# Patient Record
Sex: Male | Born: 1980 | Race: White | Hispanic: No | Marital: Married | Smoking: Former smoker
Health system: Southern US, Community
[De-identification: ages and names within clinical notes are randomized; demographics above are authoritative.]

## PROBLEM LIST (undated history)

## (undated) DIAGNOSIS — R519 Headache, unspecified: Secondary | ICD-10-CM

## (undated) DIAGNOSIS — F1910 Other psychoactive substance abuse, uncomplicated: Secondary | ICD-10-CM

## (undated) DIAGNOSIS — F32A Depression, unspecified: Secondary | ICD-10-CM

## (undated) DIAGNOSIS — K469 Unspecified abdominal hernia without obstruction or gangrene: Secondary | ICD-10-CM

## (undated) DIAGNOSIS — F329 Major depressive disorder, single episode, unspecified: Secondary | ICD-10-CM

## (undated) DIAGNOSIS — Z720 Tobacco use: Secondary | ICD-10-CM

## (undated) DIAGNOSIS — F1010 Alcohol abuse, uncomplicated: Secondary | ICD-10-CM

## (undated) HISTORY — DX: Tobacco use: Z72.0

## (undated) HISTORY — DX: Headache, unspecified: R51.9

## (undated) HISTORY — PX: VASECTOMY: SHX75

## (undated) HISTORY — DX: Alcohol abuse, uncomplicated: F10.10

## (undated) HISTORY — DX: Depression, unspecified: F32.A

## (undated) HISTORY — DX: Other psychoactive substance abuse, uncomplicated: F19.10

## (undated) HISTORY — DX: Unspecified abdominal hernia without obstruction or gangrene: K46.9

---
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
## 2001-09-27 ENCOUNTER — Emergency Department (HOSPITAL_COMMUNITY): Admission: EM | Admit: 2001-09-27 | Discharge: 2001-09-28 | Payer: Self-pay | Admitting: Emergency Medicine

## 2002-03-12 HISTORY — PX: OTHER SURGICAL HISTORY: SHX169

## 2011-07-11 ENCOUNTER — Encounter: Payer: Self-pay | Admitting: *Deleted

## 2011-07-11 ENCOUNTER — Ambulatory Visit (INDEPENDENT_AMBULATORY_CARE_PROVIDER_SITE_OTHER): Payer: BC Managed Care – PPO | Admitting: Family Medicine

## 2011-07-11 ENCOUNTER — Encounter: Payer: Self-pay | Admitting: Family Medicine

## 2011-07-11 DIAGNOSIS — IMO0001 Reserved for inherently not codable concepts without codable children: Secondary | ICD-10-CM

## 2011-07-11 DIAGNOSIS — R350 Frequency of micturition: Secondary | ICD-10-CM

## 2011-07-11 DIAGNOSIS — K439 Ventral hernia without obstruction or gangrene: Secondary | ICD-10-CM | POA: Insufficient documentation

## 2011-07-11 DIAGNOSIS — Z0000 Encounter for general adult medical examination without abnormal findings: Secondary | ICD-10-CM | POA: Insufficient documentation

## 2011-07-11 LAB — POCT URINALYSIS DIPSTICK
Bilirubin, UA: NEGATIVE
Blood, UA: NEGATIVE
Glucose, UA: NEGATIVE
Ketones, UA: NEGATIVE
Leukocytes, UA: NEGATIVE
Nitrite, UA: NEGATIVE
Spec Grav, UA: 1.01
Urobilinogen, UA: 0.2
pH, UA: 7.5

## 2011-07-11 LAB — CBC WITH DIFFERENTIAL/PLATELET
Basophils Absolute: 0 10*3/uL (ref 0.0–0.1)
Basophils Relative: 0.6 % (ref 0.0–3.0)
Eosinophils Absolute: 0 10*3/uL (ref 0.0–0.7)
Eosinophils Relative: 1.1 % (ref 0.0–5.0)
HCT: 43.1 % (ref 39.0–52.0)
Hemoglobin: 14.3 g/dL (ref 13.0–17.0)
Lymphocytes Relative: 29.2 % (ref 12.0–46.0)
Lymphs Abs: 1.2 10*3/uL (ref 0.7–4.0)
MCHC: 33.1 g/dL (ref 30.0–36.0)
MCV: 91.4 fl (ref 78.0–100.0)
Monocytes Absolute: 0.4 10*3/uL (ref 0.1–1.0)
Monocytes Relative: 10 % (ref 3.0–12.0)
Neutro Abs: 2.3 10*3/uL (ref 1.4–7.7)
Neutrophils Relative %: 59.1 % (ref 43.0–77.0)
Platelets: 237 10*3/uL (ref 150.0–400.0)
RBC: 4.72 Mil/uL (ref 4.22–5.81)
RDW: 13.8 % (ref 11.5–14.6)
WBC: 4 10*3/uL — ABNORMAL LOW (ref 4.5–10.5)

## 2011-07-11 LAB — BASIC METABOLIC PANEL
BUN: 17 mg/dL (ref 6–23)
CO2: 30 mEq/L (ref 19–32)
Calcium: 9.1 mg/dL (ref 8.4–10.5)
Chloride: 103 mEq/L (ref 96–112)
Creatinine, Ser: 1.4 mg/dL (ref 0.4–1.5)
GFR: 63.99 mL/min (ref >60.00)
Glucose, Bld: 64 mg/dL — ABNORMAL LOW (ref 70–99)
Potassium: 3.8 mEq/L (ref 3.5–5.1)
Sodium: 141 mEq/L (ref 135–145)

## 2011-07-11 LAB — HEPATIC FUNCTION PANEL
ALT: 17 U/L (ref 0–53)
AST: 22 U/L (ref 0–37)
Albumin: 4.4 g/dL (ref 3.5–5.2)
Alkaline Phosphatase: 52 U/L (ref 39–117)
Bilirubin, Direct: 0 mg/dL (ref 0.0–0.3)
Total Bilirubin: 0.7 mg/dL (ref 0.3–1.2)
Total Protein: 6.8 g/dL (ref 6.0–8.3)

## 2011-07-11 LAB — LIPID PANEL
Cholesterol: 169 mg/dL (ref 0–200)
HDL: 55.2 mg/dL (ref >39.00)
LDL Cholesterol: 100 mg/dL — ABNORMAL HIGH (ref 0–99)
Total CHOL/HDL Ratio: 3
Triglycerides: 69 mg/dL (ref 0.0–149.0)
VLDL: 13.8 mg/dL (ref 0.0–40.0)

## 2011-07-11 LAB — TSH: TSH: 1.08 u[IU]/mL (ref 0.35–5.50)

## 2011-07-11 NOTE — Progress Notes (Signed)
  Subjective:    Patient ID: Brandon Burch, male    DOB: 07-Oct-1980, 31 y.o.   MRN: [id number]  HPI New to establish.  No previous MD, last seen in [employer] 5 yrs ago.  Urinary frequency- noticed over the last year, admits to supplement use that requires increased water intake and excessive use of caffeine.  Abdominal bulge- just above belly button, occurred while working out, size of a quarter, will become mildly painful w/ excessive food intake.  Not enlarging.   Review of Systems Patient reports no vision/hearing changes, anorexia, fever ,adenopathy, persistant/recurrent hoarseness, swallowing issues, chest pain, palpitations, edema, persistant/recurrent cough, hemoptysis, dyspnea (rest,exertional, paroxysmal nocturnal), gastrointestinal  bleeding (melena, rectal bleeding), abdominal pain, excessive heart burn syncope, focal weakness, memory loss, numbness & tingling, skin/hair/nail changes, depression, anxiety, abnormal bruising/bleeding, musculoskeletal symptoms/signs.     Objective:   Physical Exam BP 118/82  Pulse 62  Temp(Src) 98.7 F (37.1 C) (Oral)  Ht 6' (1.829 m)  Wt 229 lb 3.2 oz (103.964 kg)  BMI 31.08 kg/m2  SpO2 98%  General Appearance:    Alert, cooperative, no distress, appears stated age  Head:    Normocephalic, without obvious abnormality, atraumatic  Eyes:    PERRL, conjunctiva/corneas clear, EOM's intact, fundi    benign, both eyes       Ears:    Normal TM's and external ear canals, both ears  Nose:   Nares normal, septum midline, mucosa normal, no drainage   or sinus tenderness  Throat:   Lips, mucosa, and tongue normal; teeth and gums normal  Neck:   Supple, symmetrical, trachea midline, no adenopathy;       thyroid:  No enlargement/tenderness/nodules  Back:     Symmetric, no curvature, ROM normal, no CVA tenderness  Lungs:     Clear to auscultation bilaterally, respirations unlabored  Chest wall:    No tenderness or deformity  Heart:    Regular rate and  rhythm, S1 and S2 normal, no murmur, rub   or gallop  Abdomen:     Soft, non-tender, bowel sounds active all four quadrants,    no masses, no organomegaly.  Small periumbilical/epigastric hernia  Genitalia:    Normal male without lesion, discharge or tenderness  Rectal:    Deferred due to young age  Extremities:   Extremities normal, atraumatic, no cyanosis or edema  Pulses:   2+ and symmetric all extremities  Skin:   Skin color, texture, turgor normal, no rashes or lesions  Lymph nodes:   Cervical, supraclavicular, and axillary nodes normal  Neurologic:   CNII-XII intact. Normal strength, sensation and reflexes      throughout          Assessment & Plan:

## 2011-07-11 NOTE — Patient Instructions (Signed)
You look great!  Keep up the good work! We'll notify you of your lab results Someone will call you with your surgery appt Welcome!!  We're glad to have you!!!

## 2011-07-17 NOTE — Assessment & Plan Note (Signed)
New.  Refer to surgery for discussion on tx.

## 2011-07-17 NOTE — Assessment & Plan Note (Signed)
New.  UA WNL.  Most likely due to amount of fluids consumed while taking supplements.  No need for additional w/u at this time.  Reassurance provided.

## 2011-07-17 NOTE — Assessment & Plan Note (Signed)
Pt's PE WNL w/ exception of small periumbilical/epigastric hernia.  Check labs.  Anticipatory guidance provided.

## 2011-08-03 ENCOUNTER — Encounter (INDEPENDENT_AMBULATORY_CARE_PROVIDER_SITE_OTHER): Payer: Self-pay | Admitting: General Surgery

## 2011-08-03 ENCOUNTER — Ambulatory Visit (INDEPENDENT_AMBULATORY_CARE_PROVIDER_SITE_OTHER): Payer: BC Managed Care – PPO | Admitting: General Surgery

## 2011-08-03 VITALS — BP 114/74 | HR 64 | Temp 98.2°F | Resp 14 | Ht 72.0 in | Wt 231.8 lb

## 2011-08-03 DIAGNOSIS — K439 Ventral hernia without obstruction or gangrene: Secondary | ICD-10-CM

## 2011-08-03 NOTE — Progress Notes (Signed)
Patient ID: Brandon Burch, male   DOB: 01/22/1981, 31 y.o.   MRN: [id number]  Chief Complaint  Patient presents with  . New Evaluation    New Pt. Eval abd Wall    HPI Brandon Burch is a 31 y.o. male.  This patient was referred by Dr. Taborri for evaluation of a ventral hernia. He has a small bulge just above his umbilicus which has been present for several years. He says he is first occurred while working out and doing crunches he felt some pain in the area and since then he has noticed this bulge. He says that this does cause discomfort which he describes as a "sharp pain" which is mainly after eating or drinking too much. He states that his bowels are normal blood he does always at this discomfort. HPI  Past Medical History  Diagnosis Date  . Hernia     Past Surgical History  Procedure Date  . Shoulder surgery 2004    Left Shoulder    History reviewed. No pertinent family history.  Social History History  Substance Use Topics  . Smoking status: Former Smoker    Quit date: 03/12/2009  . Smokeless tobacco: Never Used  . Alcohol Use: No    No Known Allergies  No current outpatient prescriptions on file.    Review of Systems Review of Systems All other review of systems negative or noncontributory except as stated in the HPI  Blood pressure 114/74, pulse 64, temperature 98.2 F (36.8 C), temperature source Temporal, resp. rate 14, height 6' (1.829 m), weight 231 lb 12.8 oz (105.144 kg).  Physical Exam Physical Exam Physical Exam  Vitals reviewed. Constitutional: He is oriented to person, place, and time. He appears well-developed and well-nourished. No distress.  HENT:  Head: Normocephalic and atraumatic.  Mouth/Throat: No oropharyngeal exudate.  Eyes: Conjunctivae and EOM are normal. Pupils are equal, round, and reactive to light. Right eye exhibits no discharge. Left eye exhibits no discharge. No scleral icterus.  Neck: Normal range of motion. No  tracheal deviation present.  Cardiovascular: Normal rate, regular rhythm and normal heart sounds.   Pulmonary/Chest: Effort normal and breath sounds normal. No stridor. No respiratory distress. He has no wheezes. He has no rales. He exhibits no tenderness.  Abdominal: Soft. Bowel sounds are normal. He exhibits no distension and no mass. There is no tenderness. There is no rebound and no guarding. Small supraumbilical bulge, not reducible but nontender and no evidence of strangulation Musculoskeletal: Normal range of motion. He exhibits no edema and no tenderness.  Neurological: He is alert and oriented to person, place, and time.  Skin: Skin is warm and dry. No rash noted. He is not diaphoretic. No erythema. No pallor.  Psychiatric: He has a normal mood and affect. His behavior is normal. Judgment and thought content normal.   Data Reviewed   Assessment    Supraumbilical ventral hernia He does appear to have a incarcerated supraumbilical hernia. This is nontender on exam today. It is small and located just above the umbilicus but appears to be separate from an umbilical hernia. I discussed with him the options for repair and have recommended open repair of this small ventral hernia. The risks of procedure were discussed including infection, bleeding, pain, scarring, recurrence, need for mesh, bowel injury and need for more extensive surgery and he expressed understanding and desires to proceed with open ventral hernia repair with possible mesh.    Plan    We will schedule his   procedure when available.       Brandon Burch DAVID 08/03/2011, 9:15 AM    

## 2011-08-15 ENCOUNTER — Encounter (HOSPITAL_COMMUNITY): Payer: Self-pay | Admitting: Pharmacy Technician

## 2011-08-20 ENCOUNTER — Telehealth (INDEPENDENT_AMBULATORY_CARE_PROVIDER_SITE_OTHER): Payer: Self-pay | Admitting: General Surgery

## 2011-08-20 ENCOUNTER — Telehealth (INDEPENDENT_AMBULATORY_CARE_PROVIDER_SITE_OTHER): Payer: Self-pay

## 2011-08-20 ENCOUNTER — Encounter (HOSPITAL_COMMUNITY): Payer: Self-pay | Admitting: Pharmacy Technician

## 2011-08-20 NOTE — Telephone Encounter (Signed)
Pt's wife, Shawna Orleans, calling from work, to ask about husbands's surgery.  She wants to know the "down-time" he will have and the extent of his need for help at home, etc.  Also, pt is under the impression he can return to work the next day.  Please call her to discuss.  Work # [phone].

## 2011-08-20 NOTE — Telephone Encounter (Signed)
Called patient to discuss length of being our of work after his ventral hernia repair, patient works in a [employer].  I explained to him that he should plan on being our of work at least 1-2 weeks.  Also, he can discuss this with Dr. Biagio Quint the morning of surgery.  Patient instructed to call our office if he has any other questions or concerns.

## 2011-08-22 ENCOUNTER — Encounter (HOSPITAL_COMMUNITY): Payer: Self-pay

## 2011-08-22 ENCOUNTER — Encounter (HOSPITAL_COMMUNITY)
Admission: RE | Admit: 2011-08-22 | Discharge: 2011-08-22 | Disposition: A | Discharge status: Home / Self Care | Payer: BC Managed Care – PPO | Source: Ambulatory Visit | Attending: General Surgery | Admitting: General Surgery

## 2011-08-22 VITALS — BP 145/75 | HR 57 | Temp 97.0°F | Resp 16 | Ht 72.0 in | Wt 230.1 lb

## 2011-08-22 DIAGNOSIS — K439 Ventral hernia without obstruction or gangrene: Secondary | ICD-10-CM

## 2011-08-22 LAB — CBC
MCHC: 33.3 g/dL (ref 30.0–36.0)
Platelets: 263 10*3/uL (ref 150–400)
RDW: 12.6 % (ref 11.5–15.5)
WBC: 4.2 10*3/uL (ref 4.0–10.5)

## 2011-08-22 LAB — SURGICAL PCR SCREEN
MRSA, PCR: NEGATIVE
Staphylococcus aureus: POSITIVE — AB

## 2011-08-22 NOTE — Pre-Procedure Instructions (Signed)
08/21/11 Teach Back method used for preop appointment.

## 2011-08-22 NOTE — Patient Instructions (Signed)
20 Brandon Burch  08/22/2011   Your procedure is scheduled on:  08/28/11 0730am-0950am  Report to [facility] at 0530 AM.  Call this number if you have problems the morning of surgery: [phone]   Remember:   Do not eat food:After Midnight.  May have clear liquids:until Midnight .    Take these medicines the morning of surgery with A SIP OF WATER   Do not wear jewelry,   Do not wear lotions, powders, or perfumes.  Do not shave 48 hours prior to surgery. Men may shave face and neck.  Do not bring valuables to the hospital.  Contacts, dentures or bridgework may not be worn into surgery.      Patients discharged the day of surgery will not be allowed to drive home.  Name and phone number of your driver:  Special Instructions: CHG Shower Use Special Wash: 1/2 bottle night before surgery and 1/2 bottle morning of surgery. shower chin to toes with CHG.  Wash face and private parts with regular soap.    Please read over the following fact sheets that you were given: MRSA Information, coughing and deep breathing exercises, leg exercises

## 2011-08-28 ENCOUNTER — Ambulatory Visit (HOSPITAL_COMMUNITY)
Admission: RE | Admit: 2011-08-28 | Discharge: 2011-08-28 | Disposition: A | Discharge status: Home / Self Care | Payer: BC Managed Care – PPO | Source: Ambulatory Visit | Attending: General Surgery | Admitting: General Surgery

## 2011-08-28 ENCOUNTER — Encounter (HOSPITAL_COMMUNITY): Payer: Self-pay | Admitting: Anesthesiology

## 2011-08-28 ENCOUNTER — Encounter (HOSPITAL_COMMUNITY): Payer: Self-pay | Admitting: *Deleted

## 2011-08-28 ENCOUNTER — Ambulatory Visit (HOSPITAL_COMMUNITY): Payer: BC Managed Care – PPO | Admitting: Anesthesiology

## 2011-08-28 ENCOUNTER — Encounter (HOSPITAL_COMMUNITY)
Admission: RE | Disposition: A | Discharge status: Home / Self Care | Payer: Self-pay | Source: Ambulatory Visit | Attending: General Surgery

## 2011-08-28 DIAGNOSIS — K439 Ventral hernia without obstruction or gangrene: Secondary | ICD-10-CM | POA: Insufficient documentation

## 2011-08-28 DIAGNOSIS — Z01812 Encounter for preprocedural laboratory examination: Secondary | ICD-10-CM | POA: Insufficient documentation

## 2011-08-28 HISTORY — PX: VENTRAL HERNIA REPAIR: SHX424

## 2011-08-28 SURGERY — REPAIR, HERNIA, VENTRAL
Anesthesia: General | Site: Abdomen | Wound class: Clean

## 2011-08-28 MED ORDER — MEPERIDINE HCL 50 MG/ML IJ SOLN: 6.2500 mg | INTRAMUSCULAR | Status: DC | PRN

## 2011-08-28 MED ORDER — LACTATED RINGERS IV SOLN
INTRAVENOUS | Status: DC | PRN
  Administered 2011-08-28 (×2): via INTRAVENOUS

## 2011-08-28 MED ORDER — LACTATED RINGERS IV SOLN: INTRAVENOUS | Status: DC

## 2011-08-28 MED ORDER — ONDANSETRON HCL 4 MG/2ML IJ SOLN
INTRAMUSCULAR | Status: DC | PRN
  Administered 2011-08-28: 4 mg via INTRAVENOUS

## 2011-08-28 MED ORDER — DEXAMETHASONE SODIUM PHOSPHATE 10 MG/ML IJ SOLN
INTRAMUSCULAR | Status: DC | PRN
  Administered 2011-08-28: 10 mg via INTRAVENOUS

## 2011-08-28 MED ORDER — BUPIVACAINE HCL 0.25 % IJ SOLN
INTRAMUSCULAR | Status: DC | PRN
  Administered 2011-08-28: 15 mL

## 2011-08-28 MED ORDER — BUPIVACAINE HCL (PF) 0.25 % IJ SOLN
INTRAMUSCULAR | Status: AC
  Filled 2011-08-28: qty 30

## 2011-08-28 MED ORDER — HYDROCODONE-ACETAMINOPHEN 5-325 MG PO TABS: 1.0000 | ORAL_TABLET | ORAL | Status: AC | PRN

## 2011-08-28 MED ORDER — LIDOCAINE-EPINEPHRINE 1 %-1:100000 IJ SOLN
INTRAMUSCULAR | Status: AC
  Filled 2011-08-28: qty 1

## 2011-08-28 MED ORDER — ACETAMINOPHEN 10 MG/ML IV SOLN
INTRAVENOUS | Status: AC
  Filled 2011-08-28: qty 100

## 2011-08-28 MED ORDER — CEFAZOLIN SODIUM-DEXTROSE 2-3 GM-% IV SOLR
INTRAVENOUS | Status: AC
  Filled 2011-08-28: qty 50

## 2011-08-28 MED ORDER — ACETAMINOPHEN 10 MG/ML IV SOLN
INTRAVENOUS | Status: DC | PRN
  Administered 2011-08-28: 1000 mg via INTRAVENOUS

## 2011-08-28 MED ORDER — CEFAZOLIN SODIUM-DEXTROSE 2-3 GM-% IV SOLR
2.0000 g | INTRAVENOUS | Status: AC
  Administered 2011-08-28: 2 g via INTRAVENOUS

## 2011-08-28 MED ORDER — LIDOCAINE HCL (CARDIAC) 20 MG/ML IV SOLN
INTRAVENOUS | Status: DC | PRN
  Administered 2011-08-28: 50 mg via INTRAVENOUS

## 2011-08-28 MED ORDER — 0.9 % SODIUM CHLORIDE (POUR BTL) OPTIME
TOPICAL | Status: DC | PRN
  Administered 2011-08-28: 1000 mL

## 2011-08-28 MED ORDER — FENTANYL CITRATE 0.05 MG/ML IJ SOLN
INTRAMUSCULAR | Status: DC | PRN
  Administered 2011-08-28: 150 ug via INTRAVENOUS
  Administered 2011-08-28: 100 ug via INTRAVENOUS

## 2011-08-28 MED ORDER — LIDOCAINE-EPINEPHRINE (PF) 1 %-1:200000 IJ SOLN
INTRAMUSCULAR | Status: DC | PRN
  Administered 2011-08-28: 15 mL

## 2011-08-28 MED ORDER — HYDROMORPHONE HCL PF 1 MG/ML IJ SOLN
0.2500 mg | INTRAMUSCULAR | Status: DC | PRN
Start: 2011-08-28 — End: 2011-08-28

## 2011-08-28 MED ORDER — PROMETHAZINE HCL 25 MG/ML IJ SOLN: 6.2500 mg | INTRAMUSCULAR | Status: DC | PRN

## 2011-08-28 MED ORDER — ROCURONIUM BROMIDE 100 MG/10ML IV SOLN
INTRAVENOUS | Status: DC | PRN
  Administered 2011-08-28 (×2): 10 mg via INTRAVENOUS
  Administered 2011-08-28: 50 mg via INTRAVENOUS

## 2011-08-28 MED ORDER — LIDOCAINE HCL 4 % MT SOLN
OROMUCOSAL | Status: DC | PRN
  Administered 2011-08-28: 4 mL via TOPICAL

## 2011-08-28 MED ORDER — MIDAZOLAM HCL 5 MG/5ML IJ SOLN
INTRAMUSCULAR | Status: DC | PRN
  Administered 2011-08-28: 2 mg via INTRAVENOUS

## 2011-08-28 MED ORDER — NEOSTIGMINE METHYLSULFATE 1 MG/ML IJ SOLN
INTRAMUSCULAR | Status: DC | PRN
  Administered 2011-08-28: 5 mg via INTRAVENOUS

## 2011-08-28 MED ORDER — PROPOFOL 10 MG/ML IV EMUL
INTRAVENOUS | Status: DC | PRN
  Administered 2011-08-28: 200 mg via INTRAVENOUS

## 2011-08-28 MED ORDER — GLYCOPYRROLATE 0.2 MG/ML IJ SOLN
INTRAMUSCULAR | Status: DC | PRN
  Administered 2011-08-28: .6 mg via INTRAVENOUS

## 2011-08-28 SURGICAL SUPPLY — 37 items
ADH SKN CLS APL DERMABOND .7 (GAUZE/BANDAGES/DRESSINGS) ×2
APL SKNCLS STERI-STRIP NONHPOA (GAUZE/BANDAGES/DRESSINGS)
BALL CTTN LRG ABS STRL LF (GAUZE/BANDAGES/DRESSINGS) ×2
BENZOIN TINCTURE PRP APPL 2/3 (GAUZE/BANDAGES/DRESSINGS) IMPLANT
BLADE HEX COATED 2.75 (ELECTRODE) ×3 IMPLANT
CANISTER SUCTION 2500CC (MISCELLANEOUS) ×3 IMPLANT
CLOTH BEACON ORANGE TIMEOUT ST (SAFETY) ×3 IMPLANT
COTTONBALL LRG STERILE PKG (GAUZE/BANDAGES/DRESSINGS) ×2 IMPLANT
DECANTER SPIKE VIAL GLASS SM (MISCELLANEOUS) ×3 IMPLANT
DERMABOND ADVANCED (GAUZE/BANDAGES/DRESSINGS) ×1
DERMABOND ADVANCED .7 DNX12 (GAUZE/BANDAGES/DRESSINGS) ×1 IMPLANT
DRAPE LAPAROSCOPIC ABDOMINAL (DRAPES) ×3 IMPLANT
DRSG TEGADERM 4X4.75 (GAUZE/BANDAGES/DRESSINGS) ×2 IMPLANT
ELECT COATED BLADE 2.86 ST (ELECTRODE) ×2 IMPLANT
ELECT REM PT RETURN 9FT ADLT (ELECTROSURGICAL) ×3
ELECTRODE REM PT RTRN 9FT ADLT (ELECTROSURGICAL) ×2 IMPLANT
GLOVE BIO SURGEON STRL SZ7 (GLOVE) ×3 IMPLANT
GLOVE BIOGEL PI IND STRL 7.0 (GLOVE) ×2 IMPLANT
GLOVE BIOGEL PI INDICATOR 7.0 (GLOVE) ×1
GLOVE SURG SS PI 7.5 STRL IVOR (GLOVE) ×6 IMPLANT
GOWN STRL NON-REIN LRG LVL3 (GOWN DISPOSABLE) ×3 IMPLANT
GOWN STRL REIN XL XLG (GOWN DISPOSABLE) ×6 IMPLANT
KIT BASIN OR (CUSTOM PROCEDURE TRAY) ×3 IMPLANT
NEEDLE HYPO 22GX1.5 SAFETY (NEEDLE) ×2 IMPLANT
NS IRRIG 1000ML POUR BTL (IV SOLUTION) ×3 IMPLANT
PACK GENERAL/GYN (CUSTOM PROCEDURE TRAY) ×3 IMPLANT
SPONGE GAUZE 4X4 12PLY (GAUZE/BANDAGES/DRESSINGS) ×2 IMPLANT
SPONGE LAP 18X18 X RAY DECT (DISPOSABLE) ×4 IMPLANT
STAPLER VISISTAT 35W (STAPLE) ×2 IMPLANT
STRIP CLOSURE SKIN 1/2X4 (GAUZE/BANDAGES/DRESSINGS) IMPLANT
SUT ETHIBOND 0 36 GRN (SUTURE) ×2 IMPLANT
SUT ETHIBOND 0 MO6 C/R (SUTURE) ×2 IMPLANT
SUT MNCRL AB 4-0 PS2 18 (SUTURE) ×2 IMPLANT
SUT VIC AB 3-0 SH 27 (SUTURE) ×3
SUT VIC AB 3-0 SH 27XBRD (SUTURE) ×1 IMPLANT
SYR CONTROL 10ML LL (SYRINGE) ×2 IMPLANT
TOWEL OR 17X26 10 PK STRL BLUE (TOWEL DISPOSABLE) ×3 IMPLANT

## 2011-08-28 NOTE — Transfer of Care (Signed)
Immediate Anesthesia Transfer of Care Note  Patient: Brandon Burch  Procedure(s) Performed: Procedure(s) (LRB): HERNIA REPAIR VENTRAL ADULT (N/A)  Patient Location: PACU  Anesthesia Type: General  Level of Consciousness: awake and alert   Airway & Oxygen Therapy: Patient Spontanous Breathing and Patient connected to face mask oxygen  Post-op Assessment: Report given to PACU RN and Post -op Vital signs reviewed and stable  Post vital signs: Reviewed and stable  Complications: No apparent anesthesia complications

## 2011-08-28 NOTE — Progress Notes (Signed)
Pt up OOB ambulated to bathroom.  Pt voided large amount of clear yellow urine.  Pt tolerated well.

## 2011-08-28 NOTE — Interval H&P Note (Signed)
History and Physical Interval Note:  08/28/2011 7:25 AM  Brandon Burch  has presented today for surgery, with the diagnosis of to repair defect in abdominal wall  The various methods of treatment have been discussed with the patient and family. After consideration of risks, benefits and other options for treatment, the patient has consented to  Procedure(s) (LRB): HERNIA REPAIR VENTRAL ADULT (N/A) INSERTION OF MESH (N/A) as a surgical intervention .  The patient's history has been reviewed, patient examined, no change in status, stable for surgery.  I have reviewed the patients' chart and labs.  Questions were answered to the patient's satisfaction.     [name]  Pt. Seen and evaluated.  Site marked preoperatively.  Risks of infection, bleeding, pain, scarring, recurrence, bowel injury, chronic pain, and need for repeat surgery again discussed and he desires to proceed with open ventral hernia repair with possible mesh.

## 2011-08-28 NOTE — Brief Op Note (Signed)
08/28/2011  9:08 AM  PATIENT:  Gar Gibbon Hand  31 y.o. male  PRE-OPERATIVE DIAGNOSIS:  to repair defect in abdominal wall  POST-OPERATIVE DIAGNOSIS:  to repair defect in abdominal wall  PROCEDURE:  Procedure(s) (LRB): HERNIA REPAIR VENTRAL ADULT (N/A)  SURGEON:  Surgeon(s) and Role:    * Lodema Pilot, DO - Primary  PHYSICIAN ASSISTANT:   ASSISTANTS: none   ANESTHESIA:   general  EBL:  Total I/O In: 1500 [I.V.:1500] Out: -   BLOOD ADMINISTERED:none  DRAINS: none   LOCAL MEDICATIONS USED:  MARCAINE    and LIDOCAINE   SPECIMEN:  No Specimen  DISPOSITION OF SPECIMEN:  N/A  COUNTS:  YES  TOURNIQUET:  * No tourniquets in log *  DICTATION: .Other Dictation: Dictation Number [id number]  PLAN OF CARE: Discharge to home after PACU  PATIENT DISPOSITION:  PACU - hemodynamically stable.   Delay start of Pharmacological VTE agent (>24hrs) due to surgical blood loss or risk of bleeding: no

## 2011-08-28 NOTE — Discharge Instructions (Signed)
Inguinal Hernia, Adult  Care After Refer to this sheet in the next few weeks. These discharge instructions provide you with general information on caring for yourself after you leave the hospital. Your caregiver may also give you specific instructions. Your treatment has been planned according to the most current medical practices available, but unavoidable complications sometimes occur. If you have any problems or questions after discharge, please call your caregiver. HOME CARE INSTRUCTIONS  Put ice on the operative site.   Put ice in a plastic bag.   Place a towel between your skin and the bag.   Leave the ice on for 15 to 20 minutes at a time, 3 to 4 times a day while awake.   Change bandages (dressings) as directed.   Keep the wound dry and clean. The wound may be washed gently with soap and water. Gently blot or dab the wound dry. It is okay to take showers 24 to 48 hours after surgery. Do not take baths, use swimming pools, or use hot tubs for 10 days, or as directed by your caregiver.   Only take over-the-counter or prescription medicines for pain, discomfort, or fever as directed by your caregiver.   Continue your normal diet as directed.   Do not lift anything more than 10 pounds or play contact sports for 3 weeks, or as directed.  SEEK MEDICAL CARE IF:  There is redness, swelling, or increasing pain in the wound.   There is fluid (pus) coming from the wound.   There is drainage from a wound lasting longer than 1 day.   You have an oral temperature above 102 F (38.9 C).   You notice a bad smell coming from the wound or dressing.   The wound breaks open after the stitches (sutures) have been removed.   You notice increasing pain in the shoulders (shoulder strap areas).   You develop dizzy episodes or fainting while standing.   You feel sick to your stomach (nauseous) or throw up (vomit).  SEEK IMMEDIATE MEDICAL CARE IF:  You develop a rash.   You have difficulty  breathing.   You develop a reaction or have side effects to medicines you were given.  MAKE SURE YOU:   Understand these instructions.   Will watch your condition.   Will get help right away if you are not doing well or get worse.  Document Released: 03/29/2006 Document Revised: 02/15/2011 Document Reviewed: 01/26/2009 Exit[employer] Patient Information 2012 [employer].   

## 2011-08-28 NOTE — Anesthesia Preprocedure Evaluation (Addendum)
Anesthesia Evaluation  Patient identified by MRN, date of birth, ID band Patient awake    Reviewed: Allergy & Precautions, H&P , NPO status , Patient's Chart, lab work & pertinent test results  Airway Mallampati: II TM Distance: >3 FB Neck ROM: full    Dental No notable dental hx.    Pulmonary neg pulmonary ROS,  breath sounds clear to auscultation  Pulmonary exam normal       Cardiovascular Exercise Tolerance: Good negative cardio ROS  Rhythm:regular Rate:Normal     Neuro/Psych negative neurological ROS  negative psych ROS   GI/Hepatic negative GI ROS, Neg liver ROS,   Endo/Other  negative endocrine ROS  Renal/GU negative Renal ROS  negative genitourinary   Musculoskeletal   Abdominal   Peds  Hematology negative hematology ROS (+)   Anesthesia Other Findings   Reproductive/Obstetrics negative OB ROS                           Anesthesia Physical Anesthesia Plan  ASA: II  Anesthesia Plan: General   Post-op Pain Management:    Induction:   Airway Management Planned:   Additional Equipment:   Intra-op Plan:   Post-operative Plan:   Informed Consent: I have reviewed the patients History and Physical, chart, labs and discussed the procedure including the risks, benefits and alternatives for the proposed anesthesia with the patient or authorized representative who has indicated his/her understanding and acceptance.   Dental Advisory Given  Plan Discussed with: CRNA  Anesthesia Plan Comments:         Anesthesia Quick Evaluation  

## 2011-08-28 NOTE — H&P (View-Only) (Signed)
Patient ID: Brandon Burch, male   DOB: 05/16/1980, 31 y.o.   MRN: [id number]  Chief Complaint  Patient presents with  . New Evaluation    New Pt. Eval abd Wall    HPI Brandon Burch is a 31 y.o. male.  This patient was referred by Dr. Patricia Nettle for evaluation of a ventral hernia. He has a small bulge just above his umbilicus which has been present for several years. He says he is first occurred while working out and doing crunches he felt some pain in the area and since then he has noticed this bulge. He says that this does cause discomfort which he describes as a "sharp pain" which is mainly after eating or drinking too much. He states that his bowels are normal blood he does always at this discomfort. HPI  Past Medical History  Diagnosis Date  . Hernia     Past Surgical History  Procedure Date  . Shoulder surgery 2004    Left Shoulder    History reviewed. No pertinent family history.  Social History History  Substance Use Topics  . Smoking status: Former Smoker    Quit date: 03/12/2009  . Smokeless tobacco: Never Used  . Alcohol Use: No    No Known Allergies  No current outpatient prescriptions on file.    Review of Systems Review of Systems All other review of systems negative or noncontributory except as stated in the HPI  Blood pressure 114/74, pulse 64, temperature 98.2 F (36.8 C), temperature source Temporal, resp. rate 14, height 6' (1.829 m), weight 231 lb 12.8 oz (105.144 kg).  Physical Exam Physical Exam Physical Exam  Vitals reviewed. Constitutional: He is oriented to person, place, and time. He appears well-developed and well-nourished. No distress.  HENT:  Head: Normocephalic and atraumatic.  Mouth/Throat: No oropharyngeal exudate.  Eyes: Conjunctivae and EOM are normal. Pupils are equal, round, and reactive to light. Right eye exhibits no discharge. Left eye exhibits no discharge. No scleral icterus.  Neck: Normal range of motion. No  tracheal deviation present.  Cardiovascular: Normal rate, regular rhythm and normal heart sounds.   Pulmonary/Chest: Effort normal and breath sounds normal. No stridor. No respiratory distress. He has no wheezes. He has no rales. He exhibits no tenderness.  Abdominal: Soft. Bowel sounds are normal. He exhibits no distension and no mass. There is no tenderness. There is no rebound and no guarding. Small supraumbilical bulge, not reducible but nontender and no evidence of strangulation Musculoskeletal: Normal range of motion. He exhibits no edema and no tenderness.  Neurological: He is alert and oriented to person, place, and time.  Skin: Skin is warm and dry. No rash noted. He is not diaphoretic. No erythema. No pallor.  Psychiatric: He has a normal mood and affect. His behavior is normal. Judgment and thought content normal.   Data Reviewed   Assessment    Supraumbilical ventral hernia He does appear to have a incarcerated supraumbilical hernia. This is nontender on exam today. It is small and located just above the umbilicus but appears to be separate from an umbilical hernia. I discussed with him the options for repair and have recommended open repair of this small ventral hernia. The risks of procedure were discussed including infection, bleeding, pain, scarring, recurrence, need for mesh, bowel injury and need for more extensive surgery and he expressed understanding and desires to proceed with open ventral hernia repair with possible mesh.    Plan    We will schedule his  procedure when available.       Lodema Pilot DAVID 08/03/2011, 9:15 AM

## 2011-08-28 NOTE — Progress Notes (Signed)
Ice pack given to pt at discharge. 

## 2011-08-28 NOTE — Anesthesia Postprocedure Evaluation (Signed)
  Anesthesia Post-op Note  Patient: Brandon Burch  Procedure(s) Performed: Procedure(s) (LRB): HERNIA REPAIR VENTRAL ADULT (N/A)  Patient Location: PACU  Anesthesia Type: General  Level of Consciousness: awake and alert   Airway and Oxygen Therapy: Patient Spontanous Breathing  Post-op Pain: mild  Post-op Assessment: Post-op Vital signs reviewed, Patient's Cardiovascular Status Stable, Respiratory Function Stable, Patent Airway and No signs of Nausea or vomiting  Post-op Vital Signs: stable  Complications: No apparent anesthesia complications

## 2011-08-29 NOTE — Op Note (Signed)
[name], [name] NO.:  [id number]  MEDICAL RECORD NO.:  [id number]  LOCATION:  [facility]                         FACILITY:  [facility]  PHYSICIAN:  Lodema Pilot, MD       DATE OF BIRTH:  02-Sep-1980  DATE OF PROCEDURE:  08/28/2011 DATE OF DISCHARGE:  08/28/2011                              OPERATIVE REPORT   PROCEDURE:  Ventral hernia repair.  PREOPERATIVE DIAGNOSIS:  Ventral hernia.  POSTOPERATIVE DIAGNOSIS:  Ventral hernia.  SURGEON:  Lodema Pilot, MD  ASSISTANT:  None.  ANESTHESIA:  General endotracheal tube anesthesia with 30 cc of 1% lidocaine with epinephrine and 0.25% Marcaine in a 50:50 mixture.  FLUIDS:  1200 cc crystalloid.  ESTIMATED BLOOD LOSS:  Minimal.  DRAINS:  None.  SPECIMENS:  None.  COMPLICATIONS:  None apparent.  FINDINGS:  Two tiny supraumbilical defects, healthy-appearing fascia, preperitoneal, incarcerated in the hernia defect.  The hernia defects were approximated with 0 Ethibond sutures.  INDICATION FOR PROCEDURE:  Brandon Burch is a 31 year old male with a supraumbilical ventral hernia by history and on exam, who desired surgical repair since this is incarcerated.  OPERATIVE NOTE:  Brandon Burch was seen and evaluated in the preoperative area and risks and benefits of procedure were again discussed in lay terms.  Informed consent was obtained.  Surgical site was marked prior to anesthetic administration of the patient and he was taken to the operating room, given prophylactic antibiotics, and placed on the table in supine position.  General endotracheal tube anesthesia was obtained and his abdomen was prepped and draped in a standard surgical fashion.  A supraumbilical midline incision was made in the skin over the defect.  Dissection carried down into the subcutaneous tissue using Bovie electrocautery.  The hernia was identified.  There was noted to be some incarcerated preperitoneal fat that was dissected down to the  fascia circumferentially around the hernia contents and I amputated the fat.  He had a very tiny defect, which was approximately only 4 mm in diameter.  The fascia was very healthy appearing around this, although he did have another tiny 2 mm defect nearby.  I did not see any other problems other than these 2 small defects.  I decided to close these primarily using interrupted 0 Ethibond sutures taking care to avoid injury to underlying bowel contents.  The defects were approximated with 0 Ethibond sutures.  The fascia appeared healthy and the defects appeared to be well closed.  Subcutaneous tissue was then injected with 30 cc of 1% lidocaine with epinephrine and 0.25% Marcaine in a 50:50 mixture.  The wound was irrigated with sterile saline solution and was noted to be hemostatic, and the dermis was approximated with 3-0 Vicryl subcuticular sutures.  The skin was approximated with 4- 0 Monocryl subcuticular suture and the skin was washed and dried and Dermabond was applied.  Sterile pressure dressing was applied.  All sponge, needle, instrument counts were correct at the end of the case. The patient tolerated the procedure well without apparent complications.          ______________________________ Lodema Pilot, MD     BL/MEDQ  D:  08/28/2011  T:  08/29/2011  Job:  [id number]

## 2011-08-30 ENCOUNTER — Encounter (HOSPITAL_COMMUNITY): Payer: Self-pay | Admitting: General Surgery

## 2011-09-20 ENCOUNTER — Ambulatory Visit (INDEPENDENT_AMBULATORY_CARE_PROVIDER_SITE_OTHER): Payer: BC Managed Care – PPO | Admitting: General Surgery

## 2011-09-20 DIAGNOSIS — Z5189 Encounter for other specified aftercare: Secondary | ICD-10-CM

## 2011-09-20 DIAGNOSIS — Z4889 Encounter for other specified surgical aftercare: Secondary | ICD-10-CM

## 2011-09-20 NOTE — Progress Notes (Signed)
Subjective:     Patient ID: Brandon Burch, male   DOB: 1980/03/23, 31 y.o.   MRN: [id number]  HPI This patient follows up approximately 3-1/2 weeks status post open supraumbilical hernia repair. I did not place mesh due to very small defects. E says that he return to work 2 days after his procedure and has not taken any pain medication postoperatively. He is asking when he can restart his heavy weights. He is moving his bowels and tolerating regular diet. He has no complaints.  Review of Systems     Objective:   Physical Exam His incision is healing well without sign of infection. There is no evidence of hernia recurrence.    Assessment:     Status post open supraumbilical hernia repair. He seems to be doing very well and has no complaints. He is asking to return to his heavy weightlifting. I recommended that he continue with light duty for another week at least and then gradually increase his activities. I explained that he will be at risk for hernia recurrence with the heavy lifting.But overall he seems to be doing very well from this procedure.    Plan:     He can followup with me on a p.r.n. basis

## 2011-10-01 ENCOUNTER — Other Ambulatory Visit: Payer: Self-pay | Admitting: Physical Medicine and Rehabilitation

## 2011-10-01 DIAGNOSIS — M5126 Other intervertebral disc displacement, lumbar region: Secondary | ICD-10-CM

## 2011-10-01 DIAGNOSIS — M545 Low back pain: Secondary | ICD-10-CM

## 2011-10-26 ENCOUNTER — Ambulatory Visit
Admission: RE | Admit: 2011-10-26 | Discharge: 2011-10-26 | Disposition: A | Discharge status: Home / Self Care | Payer: BC Managed Care – PPO | Source: Ambulatory Visit | Attending: Physical Medicine and Rehabilitation | Admitting: Physical Medicine and Rehabilitation

## 2011-10-26 VITALS — BP 119/60 | HR 50

## 2011-10-26 DIAGNOSIS — M5126 Other intervertebral disc displacement, lumbar region: Secondary | ICD-10-CM

## 2011-10-26 DIAGNOSIS — M545 Low back pain: Secondary | ICD-10-CM

## 2011-10-26 MED ORDER — OXYCODONE-ACETAMINOPHEN 5-325 MG PO TABS
1.0000 | ORAL_TABLET | Freq: Once | ORAL | Status: AC
  Administered 2011-10-26: 1 via ORAL

## 2011-10-26 MED ORDER — DIAZEPAM 5 MG PO TABS
5.0000 mg | ORAL_TABLET | Freq: Once | ORAL | Status: AC
  Administered 2011-10-26: 5 mg via ORAL

## 2011-10-26 MED ORDER — IOHEXOL 180 MG/ML  SOLN
15.0000 mL | Freq: Once | INTRAMUSCULAR | Status: AC | PRN
  Administered 2011-10-26: 15 mL via INTRATHECAL

## 2011-10-26 NOTE — Progress Notes (Signed)
Pt c/o headache and some low back pain. Pain meds given and cold pack to neck.

## 2012-04-28 ENCOUNTER — Ambulatory Visit (INDEPENDENT_AMBULATORY_CARE_PROVIDER_SITE_OTHER): Payer: BC Managed Care – PPO | Admitting: Family Medicine

## 2012-04-28 ENCOUNTER — Encounter: Payer: Self-pay | Admitting: Family Medicine

## 2012-04-28 VITALS — BP 110/72 | HR 69 | Temp 98.3°F | Ht 72.25 in | Wt 222.6 lb

## 2012-04-28 DIAGNOSIS — J020 Streptococcal pharyngitis: Secondary | ICD-10-CM

## 2012-04-28 LAB — POCT RAPID STREP A (OFFICE): Rapid Strep A Screen: POSITIVE — AB

## 2012-04-28 MED ORDER — AMOXICILLIN 875 MG PO TABS: 875.0000 mg | ORAL_TABLET | Freq: Two times a day (BID) | ORAL | Status: DC

## 2012-04-28 NOTE — Assessment & Plan Note (Signed)
New.  Pt w/ + strep test and recent exposure.  Start abx.  Reviewed supportive care and red flags that should prompt return.  Pt expressed understanding and is in agreement w/ plan.

## 2012-04-28 NOTE — Progress Notes (Signed)
  Subjective:    Patient ID: Brandon Burch, male    DOB: 1980-12-29, 32 y.o.   MRN: [id number]  HPI Sore throat- sxs started last night.  Wife and son both w/ strep throat.  No fever.  No cough.  + nasal congestion.  Last week had vomiting and diarrhea.   Review of Systems For ROS see HPI     Objective:   Physical Exam  Vitals reviewed. Constitutional: He is oriented to person, place, and time. He appears well-developed and well-nourished. No distress.  HENT:  Head: Normocephalic and atraumatic.  TMs normal bilaterally No TTP over sinuses Tonsillar enlargement and erythema, no obvious exudates  Neck: Normal range of motion. Neck supple.  Cardiovascular: Normal rate, regular rhythm and normal heart sounds.   Pulmonary/Chest: Effort normal and breath sounds normal. No respiratory distress. He has no wheezes. He has no rales.  Lymphadenopathy:    He has cervical adenopathy (TTP).  Neurological: He is alert and oriented to person, place, and time.  Skin: Skin is warm and dry.          Assessment & Plan:

## 2012-04-28 NOTE — Patient Instructions (Addendum)
This is strep throat Start the Amoxicillin twice daily- take w/ food Drink plenty of fluids Ibuprofen (advil) for pain and fever relief Call with any questions or concerns Hang in there!!!

## 2012-05-12 ENCOUNTER — Encounter: Payer: Self-pay | Admitting: Family Medicine

## 2012-05-12 ENCOUNTER — Ambulatory Visit (INDEPENDENT_AMBULATORY_CARE_PROVIDER_SITE_OTHER): Payer: BC Managed Care – PPO | Admitting: Family Medicine

## 2012-05-12 VITALS — BP 110/70 | HR 66 | Temp 98.2°F | Ht 72.25 in | Wt 221.6 lb

## 2012-05-12 DIAGNOSIS — J020 Streptococcal pharyngitis: Secondary | ICD-10-CM

## 2012-05-12 LAB — POCT RAPID STREP A (OFFICE): Rapid Strep A Screen: NEGATIVE

## 2012-05-12 NOTE — Patient Instructions (Addendum)
The rapid test was negative We'll notify you of your culture result Drink plenty of fluids Ibuprofen as needed for pain or fever Hang in there!

## 2012-05-12 NOTE — Progress Notes (Signed)
  Subjective:    Patient ID: Brandon Burch, male    DOB: 03-Aug-1980, 32 y.o.   MRN: [id number]  HPI Strep throat- dx'd on 2/17, finished abx on Friday 2/28.  Wife again w/ strep.  Pt's throat is 'a little sore'.  No fever.  No cough.   Review of Systems For ROS see HPI     Objective:   Physical Exam  Vitals reviewed. Constitutional: He appears well-developed and well-nourished. No distress.  HENT:  Head: Normocephalic and atraumatic.  Nose: Nose normal.  Mouth/Throat: No oropharyngeal exudate.  TMs WNL bilaterally + PND  Neck: Normal range of motion. Neck supple.  Lymphadenopathy:    He has no cervical adenopathy.          Assessment & Plan:

## 2012-05-12 NOTE — Assessment & Plan Note (Signed)
Pt completed course of abx.  Pt reports feeling well w/ exception of slight sore throat.  Rapid strep negative.  Culture sent.  No need for abx at this time.

## 2012-05-14 ENCOUNTER — Encounter: Payer: Self-pay | Admitting: *Deleted

## 2012-06-21 ENCOUNTER — Encounter (HOSPITAL_BASED_OUTPATIENT_CLINIC_OR_DEPARTMENT_OTHER): Payer: Self-pay | Admitting: *Deleted

## 2012-06-21 ENCOUNTER — Emergency Department (HOSPITAL_BASED_OUTPATIENT_CLINIC_OR_DEPARTMENT_OTHER)
Admission: EM | Admit: 2012-06-21 | Discharge: 2012-06-21 | Disposition: A | Discharge status: Home / Self Care | Payer: BC Managed Care – PPO | Attending: Emergency Medicine | Admitting: Emergency Medicine

## 2012-06-21 DIAGNOSIS — Z8719 Personal history of other diseases of the digestive system: Secondary | ICD-10-CM | POA: Insufficient documentation

## 2012-06-21 DIAGNOSIS — Z87891 Personal history of nicotine dependence: Secondary | ICD-10-CM | POA: Insufficient documentation

## 2012-06-21 DIAGNOSIS — R109 Unspecified abdominal pain: Secondary | ICD-10-CM | POA: Insufficient documentation

## 2012-06-21 DIAGNOSIS — R300 Dysuria: Secondary | ICD-10-CM | POA: Insufficient documentation

## 2012-06-21 DIAGNOSIS — Z79899 Other long term (current) drug therapy: Secondary | ICD-10-CM | POA: Insufficient documentation

## 2012-06-21 DIAGNOSIS — K529 Noninfective gastroenteritis and colitis, unspecified: Secondary | ICD-10-CM

## 2012-06-21 DIAGNOSIS — K5289 Other specified noninfective gastroenteritis and colitis: Secondary | ICD-10-CM | POA: Insufficient documentation

## 2012-06-21 DIAGNOSIS — R630 Anorexia: Secondary | ICD-10-CM | POA: Insufficient documentation

## 2012-06-21 DIAGNOSIS — R509 Fever, unspecified: Secondary | ICD-10-CM | POA: Insufficient documentation

## 2012-06-21 DIAGNOSIS — R112 Nausea with vomiting, unspecified: Secondary | ICD-10-CM | POA: Insufficient documentation

## 2012-06-21 LAB — URINALYSIS, ROUTINE W REFLEX MICROSCOPIC
Glucose, UA: NEGATIVE mg/dL
Hgb urine dipstick: NEGATIVE
Ketones, ur: NEGATIVE mg/dL
Protein, ur: NEGATIVE mg/dL

## 2012-06-21 MED ORDER — SODIUM CHLORIDE 0.9 % IV BOLUS (SEPSIS)
1000.0000 mL | Freq: Once | INTRAVENOUS | Status: AC
  Administered 2012-06-21: 1000 mL via INTRAVENOUS

## 2012-06-21 NOTE — ED Provider Notes (Signed)
History    This chart was scribed for Rolan Bucco, MD scribed by Magnus Sinning. The patient was seen in room MH05/MH05 at 20:27   CSN: [id number]  Arrival date & time 06/21/12  1853     Chief Complaint  Patient presents with  . Diarrhea    (Consider location/radiation/quality/duration/timing/severity/associated sxs/prior treatment) Patient is a 32 y.o. male presenting with diarrhea. The history is provided by the patient. No language interpreter was used.  Diarrhea Associated symptoms: abdominal pain, chills, fever and vomiting    Brandon Burch is a 32 y.o. male who presents to the Emergency Department complaining of constant moderate watery diarrhea with possible mucous, onset three days. He reports associated emesis, nausea, fevers, abd pain, and decreased appetite. The patient states the emesis resolved yesterday and relative notes emesis was induced voluntarily to relieve constant nausea.  He reports several episodes of diarrhea, stating frequency is about two times per hour. He says diarrhea is aggravated when he attempts to eat or drink and he explains that he has tried to eat part of a cheeseburger and part of a hot dog at lunch. Also notes he also ate some jelly beans a couple hours ago with 8 oz juice that he says caused diarrhea.  He notes abd pain episodes occur before and after diarrhea. Pt says he has not been out of the country,  camping, or been on an abx recently. The patient explains that he did eat some undercooked chicken recently, which he suspects is cause of sxs.  Past Medical History  Diagnosis Date  . Hernia     Past Surgical History  Procedure Laterality Date  . Shoulder surgery  2004    Left Shoulder  . Ventral hernia repair  08/28/2011    Procedure: HERNIA REPAIR VENTRAL ADULT;  Surgeon: Lodema Pilot, DO;  Location: [facility];  Service: General;  Laterality: N/A;  Repair of Ventral Hernia    History reviewed. No pertinent family history.  History   Substance Use Topics  . Smoking status: Former Smoker    Quit date: 03/12/2009  . Smokeless tobacco: Never Used  . Alcohol Use: No      Review of Systems  Constitutional: Positive for fever and chills.  Gastrointestinal: Positive for nausea, vomiting, abdominal pain and diarrhea. Negative for blood in stool.  Genitourinary: Positive for dysuria. Negative for difficulty urinating.       Notes that he has occasionally dysuria that is normal with baseline  All other systems reviewed and are negative.    Allergies  Review of patient's allergies indicates no known allergies.  Home Medications   Current Outpatient Rx  Name  Route  Sig  Dispense  Refill  . ALPRAZolam (XANAX) 0.25 MG tablet   Oral   Take 0.25 mg by mouth at bedtime as needed for sleep.         Marland Kitchen amoxicillin (AMOXIL) 875 MG tablet   Oral   Take 1 tablet (875 mg total) by mouth 2 (two) times daily.   20 tablet   0   . escitalopram (LEXAPRO) 20 MG tablet   Oral   Take 20 mg by mouth daily. Given by provider with Dr. Andee Poles.           BP 136/85  Pulse 62  Temp(Src) 98 F (36.7 C) (Oral)  Resp 18  Ht 6' (1.829 m)  Wt 210 lb (95.255 kg)  BMI 28.47 kg/m2  SpO2 100%  Physical Exam  Constitutional: He is  oriented to person, place, and time. He appears well-developed and well-nourished.  HENT:  Head: Normocephalic and atraumatic.  Eyes: Pupils are equal, round, and reactive to light.  Neck: Normal range of motion. Neck supple.  Cardiovascular: Normal rate, regular rhythm and normal heart sounds.   Pulmonary/Chest: Effort normal and breath sounds normal. No respiratory distress. He has no wheezes. He has no rales. He exhibits no tenderness.  Abdominal: Soft. Bowel sounds are normal. There is no tenderness. There is no rebound and no guarding.  Musculoskeletal: Normal range of motion. He exhibits no edema.  Lymphadenopathy:    He has no cervical adenopathy.  Neurological: He is alert and  oriented to person, place, and time.  Skin: Skin is warm and dry. No rash noted.  Psychiatric: He has a normal mood and affect.    ED Course  Procedures (including critical care time) DIAGNOSTIC STUDIES: Oxygen Saturation is 100% on room air, normal by my interpretation.    COORDINATION OF CARE: 20:29: Physical exam performed.  Results for orders placed during the hospital encounter of 06/21/12  URINALYSIS, ROUTINE W REFLEX MICROSCOPIC      Result Value Range   Color, Urine YELLOW  YELLOW   APPearance CLEAR  CLEAR   Specific Gravity, Urine 1.034 (*) 1.005 - 1.030   pH 5.0  5.0 - 8.0   Glucose, UA NEGATIVE  NEGATIVE mg/dL   Hgb urine dipstick NEGATIVE  NEGATIVE   Bilirubin Urine NEGATIVE  NEGATIVE   Ketones, ur NEGATIVE  NEGATIVE mg/dL   Protein, ur NEGATIVE  NEGATIVE mg/dL   Urobilinogen, UA 0.2  0.0 - 1.0 mg/dL   Nitrite NEGATIVE  NEGATIVE   Leukocytes, UA NEGATIVE  NEGATIVE     1. Gastroenteritis       MDM  Patient is well-appearing with no abdominal tenderness. He was given some IV fluids. I feel this is likely a viral syndrome. He was discharged home in good condition I advised him to followup with primary care provider if his symptoms are not improving within next few days or return here as needed for any worsening symptoms. I also discussed the importance of the Brat diet I personally performed the services described in this documentation, which was scribed in my presence.  The recorded information has been reviewed and considered.        Rolan Bucco, MD 06/21/12 2106

## 2012-06-21 NOTE — ED Notes (Signed)
MD with pt  

## 2012-06-21 NOTE — ED Notes (Signed)
N/V/D since Thursday. No vomiting since Fri, but has had diarrhea. No relief with OTC meds. Decreased appetite.

## 2012-07-17 ENCOUNTER — Ambulatory Visit (INDEPENDENT_AMBULATORY_CARE_PROVIDER_SITE_OTHER): Payer: BC Managed Care – PPO | Admitting: Family Medicine

## 2012-07-17 ENCOUNTER — Encounter: Payer: Self-pay | Admitting: Family Medicine

## 2012-07-17 VITALS — BP 118/70 | HR 72 | Temp 98.0°F | Ht 72.25 in | Wt 225.2 lb

## 2012-07-17 DIAGNOSIS — J020 Streptococcal pharyngitis: Secondary | ICD-10-CM

## 2012-07-17 LAB — POCT RAPID STREP A (OFFICE): Rapid Strep A Screen: POSITIVE — AB

## 2012-07-17 MED ORDER — AMOXICILLIN 875 MG PO TABS: 875.0000 mg | ORAL_TABLET | Freq: Two times a day (BID) | ORAL | Status: DC

## 2012-07-17 NOTE — Assessment & Plan Note (Signed)
Pt's sxs consistent w/ strep and rapid test confirms.  Start amox.  Reviewed supportive care and red flags that should prompt return.  Pt expressed understanding and is in agreement w/ plan.

## 2012-07-17 NOTE — Patient Instructions (Addendum)
This is strep Start the Amox twice daily Drink plenty of fluids Tylenol or ibuprofen for pain or fever REST! Hang in there!

## 2012-07-17 NOTE — Progress Notes (Signed)
  Subjective:    Patient ID: Brandon Burch, male    DOB: 02/13/1981, 32 y.o.   MRN: [id number]  HPI Sore throat- + sick contacts.  sxs started 2 days ago.  No fevers.  'abnormally tired'.  Mild nausea.  + nasal congestion.  No cough.  Throat feels tight, hurts to swallow.   Review of Systems For ROS see HPI     Objective:   Physical Exam  Vitals reviewed. Constitutional: He appears well-developed and well-nourished. No distress.  HENT:  Head: Normocephalic and atraumatic.  Nose: Nose normal.  TMs normal bilaterally Post pharynx erythematous, tonsillar swelling and exudate present  Neck: Normal range of motion. Neck supple.  Pulmonary/Chest: Effort normal and breath sounds normal. No respiratory distress. He has no wheezes.  Lymphadenopathy:    He has cervical adenopathy.          Assessment & Plan:

## 2012-07-23 ENCOUNTER — Ambulatory Visit (INDEPENDENT_AMBULATORY_CARE_PROVIDER_SITE_OTHER): Payer: BC Managed Care – PPO | Admitting: Internal Medicine

## 2012-07-23 ENCOUNTER — Encounter: Payer: Self-pay | Admitting: Internal Medicine

## 2012-07-23 VITALS — BP 122/82 | HR 62 | Temp 97.9°F | Wt 224.0 lb

## 2012-07-23 DIAGNOSIS — J020 Streptococcal pharyngitis: Secondary | ICD-10-CM

## 2012-07-23 DIAGNOSIS — J029 Acute pharyngitis, unspecified: Secondary | ICD-10-CM

## 2012-07-23 MED ORDER — AZITHROMYCIN 500 MG PO TABS: 500.0000 mg | ORAL_TABLET | Freq: Every day | ORAL | Status: DC

## 2012-07-23 NOTE — Patient Instructions (Addendum)
Rest, fluids , tylenol Take the antibiotic as prescribed: zithromax x 5 days  Call if no better in few days Call anytime if the symptoms are severe

## 2012-07-23 NOTE — Progress Notes (Signed)
  Subjective:    Patient ID: Brandon Burch, male    DOB: 1980-03-14, 32 y.o.   MRN: [id number]  HPI Acute visit. Was seen last week with sore throat, strep test was positive, started amoxicillin. Since then, he is not better. Continue with sore throat, worse, throat feels tight and swollen. Continue with fatigue, is a little worse. Ears continue hurting. His son was diagnosed with strep.  Past Medical History  Diagnosis Date  . Hernia    Past Surgical History  Procedure Laterality Date  . Shoulder surgery  2004    Left Shoulder  . Ventral hernia repair  08/28/2011    Procedure: HERNIA REPAIR VENTRAL ADULT;  Surgeon: Lodema Pilot, DO;  Location: [facility];  Service: General;  Laterality: N/A;  Repair of Ventral Hernia     Review of Systems No fevers per se. Occasional nausea, no vomiting or diarrhea. States nausea is worse when he walks, no worse w/ po intake. No rash. No sinus pain per se, mild nasal discharge. Some headache, mostly on the right side.    Objective:   Physical Exam General -- alert, well-developed, Nontoxic appearing. Vital signs stable, afebrile.   Neck --no thyromegaly, Few bilateral small LADs. HEENT -- TMs normal, throat w/ Minimal redness, no discharge, tonsils are actually small.Nose is slightly congested, face symmetric and not tender to palpation in the maxillary sinuses Lungs -- normal respiratory effort, no intercostal retractions, no accessory muscle use, and normal breath sounds.   Heart-- normal rate, regular rhythm, no murmur, and no gallop.   Abdomen--Not distended, soft, Mild midabdominal discomfort with palpation w/o mass or rebound Extremities-- no pretibial edema bilaterally  Neurologic-- alert & oriented X3 and strength normal in all extremities. Psych-- Cognition and judgment appear intact. Alert and cooperative with normal attention span and concentration.  not anxious appearing and not depressed appearing.       Assessment & Plan:   Strep throat, Recently diagnosed with strep throat, objectively is better (physical exam last week showed red enlarged tonsils) subjectively however he feels worse. Pt  suggested to change to a different antibiotic, I think that would be okay. Plan: Switched to Zithromax 500 mg daily for 5 days. If he is not improving, will need further eval including checking for mono

## 2012-07-24 ENCOUNTER — Encounter: Payer: Self-pay | Admitting: Internal Medicine

## 2012-07-25 LAB — CULTURE, GROUP A STREP

## 2012-07-30 ENCOUNTER — Ambulatory Visit (INDEPENDENT_AMBULATORY_CARE_PROVIDER_SITE_OTHER): Payer: BC Managed Care – PPO | Admitting: Family Medicine

## 2012-07-30 ENCOUNTER — Encounter: Payer: Self-pay | Admitting: Family Medicine

## 2012-07-30 VITALS — BP 110/70 | HR 69 | Temp 98.1°F | Ht 72.25 in | Wt 226.0 lb

## 2012-07-30 DIAGNOSIS — J309 Allergic rhinitis, unspecified: Secondary | ICD-10-CM

## 2012-07-30 DIAGNOSIS — J020 Streptococcal pharyngitis: Secondary | ICD-10-CM

## 2012-07-30 LAB — POCT RAPID STREP A (OFFICE): Rapid Strep A Screen: NEGATIVE

## 2012-07-30 NOTE — Progress Notes (Signed)
  Subjective:    Patient ID: Brandon Burch, male    DOB: 09-26-1980, 32 y.o.   MRN: [id number]  HPI Sore throat- pt was treated for strep earlier this month.  Saw Dr Drue Novel last week and had negative throat culture.  Was switched to Zpack from Amox and pain worsened after finishing abx.  No fever.  R ear pain.  + nasal congestion.  + PND.  No facial pain/pressure.  + sore cervical LNs.  + cough.   Review of Systems For ROS see HPI     Objective:   Physical Exam  Vitals reviewed. Constitutional: He appears well-developed and well-nourished. No distress.  HENT:  Head: Normocephalic and atraumatic.  No TTP over sinuses + turbinate edema + PND TMs normal bilaterally  Eyes: Conjunctivae and EOM are normal. Pupils are equal, round, and reactive to light.  Neck: Normal range of motion. Neck supple.  Cardiovascular: Normal rate, regular rhythm and normal heart sounds.   Pulmonary/Chest: Effort normal and breath sounds normal. No respiratory distress. He has no wheezes.  Lymphadenopathy:    He has no cervical adenopathy.  Skin: Skin is warm and dry.          Assessment & Plan:

## 2012-07-30 NOTE — Assessment & Plan Note (Signed)
New.  Suspect this is cause of pt's sore throat.  Due to recent strep exposure will get culture in face of negative rapid strep.  Encouraged OTC antihistamine to decrease PND.  Reviewed supportive care and red flags that should prompt return.  Pt expressed understanding and is in agreement w/ plan.

## 2012-07-30 NOTE — Patient Instructions (Addendum)
Start Claritin or Zyrtec daily to decrease the nasal congestion, sore throat (post nasal drip) and ear fullness Drink plenty of fluids Ibuprofen as needed for sore throat We'll call you with your culture results Call with any questions or concerns Hang in there!

## 2012-07-31 LAB — CULTURE, GROUP A STREP: Organism ID, Bacteria: NORMAL

## 2012-10-21 ENCOUNTER — Encounter: Payer: Self-pay | Admitting: Internal Medicine

## 2012-10-21 ENCOUNTER — Ambulatory Visit (INDEPENDENT_AMBULATORY_CARE_PROVIDER_SITE_OTHER): Payer: BC Managed Care – PPO | Admitting: Internal Medicine

## 2012-10-21 VITALS — BP 126/80 | HR 64 | Temp 98.0°F | Wt 232.0 lb

## 2012-10-21 DIAGNOSIS — R21 Rash and other nonspecific skin eruption: Secondary | ICD-10-CM

## 2012-10-21 DIAGNOSIS — N489 Disorder of penis, unspecified: Secondary | ICD-10-CM

## 2012-10-21 NOTE — Patient Instructions (Addendum)
Use Eucerin mixed 1 part to 1 part Cort Aid OTC   twice a day  for the dry skin. Use hypoallergenic cleansing motions. Bathe with moisturizing liquid soap , not bar soap.

## 2012-10-21 NOTE — Progress Notes (Signed)
  Subjective:    Patient ID: Brandon Burch, male    DOB: 06-20-1980, 32 y.o.   MRN: [id number]  HPI   Symptoms began approximately 2 months ago as dryness of the skin of the penis with resultant cracking and bleeding. It is worse after sex with itching & burning.  There was no trigger or initiating factor for this such as contact with medications/ douches.  He's used various treatments without benefit. These include antifungals, Vaseline, Neosporin, Triple Antibiotic ointment with minimal benefit.  He has had a history of allergic rhinitis; he denies eczema    Review of Systems   He denies symptoms of uveitis , low back pain or other arthralgias  He denies presence of any blisters; dysuria, hematuria, or pyuria  He's had no fever, chills, sweats, or weight loss.     Objective:   Physical Exam Gen.: Healthy and well-nourished in appearance. Alert, appropriate and cooperative throughout exam. Eyes: No corneal or conjunctival inflammation noted.  Abdomen: Bowel sounds normal; abdomen soft and nontender. No masses, organomegaly or hernias noted. Genitalia: Genitalia normal except forlesions.  Skin: 2 flat areas of dermatitis without secondary cellulitis or blister formation of the penis distally at the base of the head. Lymph: No cervical, axillary, or inguinal lymphadenopathy present. Psych: Mood and affect are normal. Normally interactive                                                                                        Assessment & Plan:  #1 dermatitis body the penis;subjectively due to dryness. No evidence of cellulitis. Clinically not herpetic  Plan: See orders and recommendations

## 2013-08-03 IMAGING — CT CT L SPINE W/ CM
4 of 8 series · 14 of 33 positions shown, 16 images · IV contrast (omnipaque)
Comparison: MRI 08/14/2010

MYELOGRAM INJECTION
TECHNIQUE: Informed consent was obtained from the patient prior to
the procedure, including potential complications of headache,
allergy, infection and pain. Specific instructions were given
regarding 24 hour bedrest post procedure to prevent post-LP
headache.  A timeout procedure was performed.  With the patient
prone, the lower back was prepped with Betadine.  1% Lidocaine was
used for local anesthesia.  Lumbar puncture was performed by the
radiologist at the L5-S1 level using a 22 gauge needle with return
of clear CSF.  15 cc of Omnipaque 180 was injected into the
subarachnoid space .

I personally performed the lumbar puncture and administered the
intrathecal contrast. I also personally supervised acquisition of
the myelogram images.
CLINICAL DATA: Right leg pain.
TECHNIQUE: Multidetector CT imaging of the lumbar spine was
performed following myelography.  Multiplanar CT image
reconstructions were also generated.

[Series 2: l spine bone · axial · 0.27mm/px · z∈[-114,-4]mm · 3 of 89 slices shown, 4 images]
[im 23/89  soft-tissue]
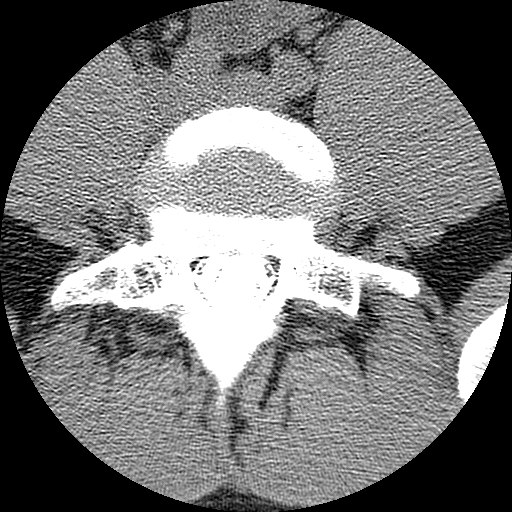
[im 23/89  bone]
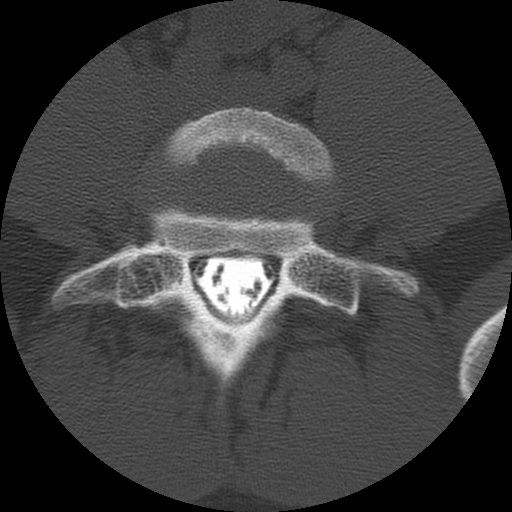
[im 45/89  bone]
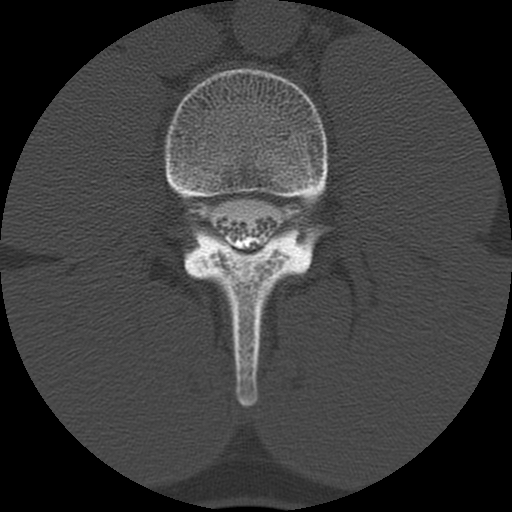
[im 67/89  bone]
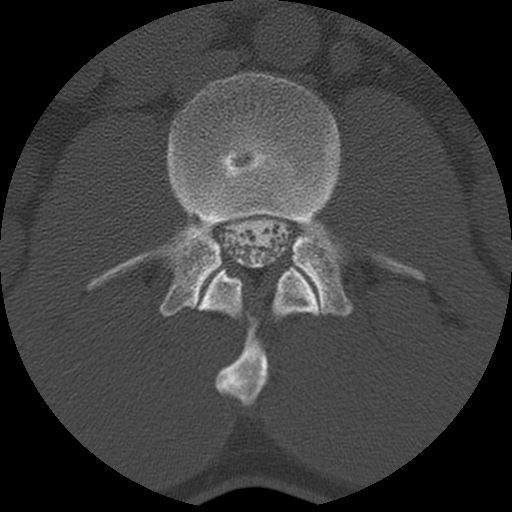

[Series 3: l spine soft · axial · 0.27mm/px · z∈[-114,-4]mm · 3 of 89 slices shown]
[im 23/89  soft-tissue]
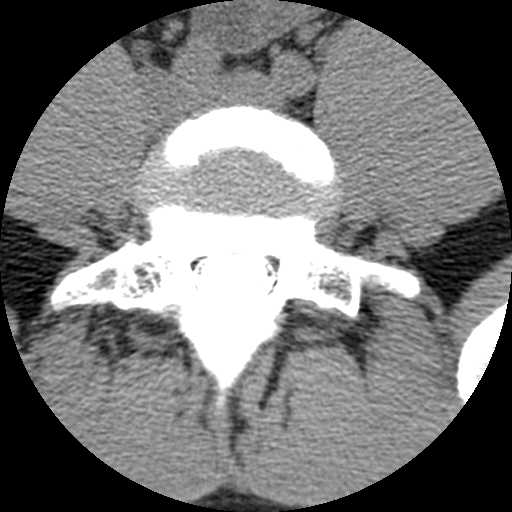
[im 45/89  soft-tissue]
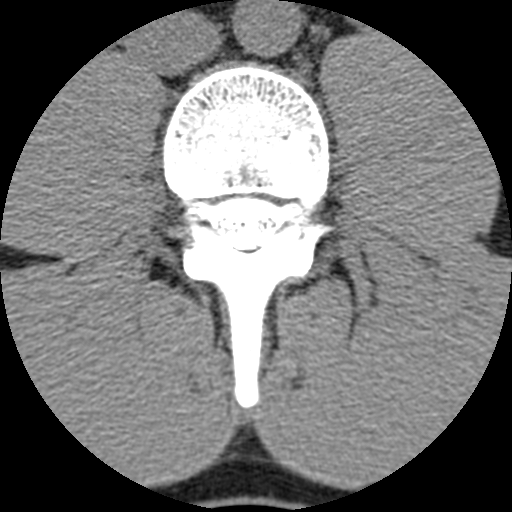
[im 67/89  soft-tissue]
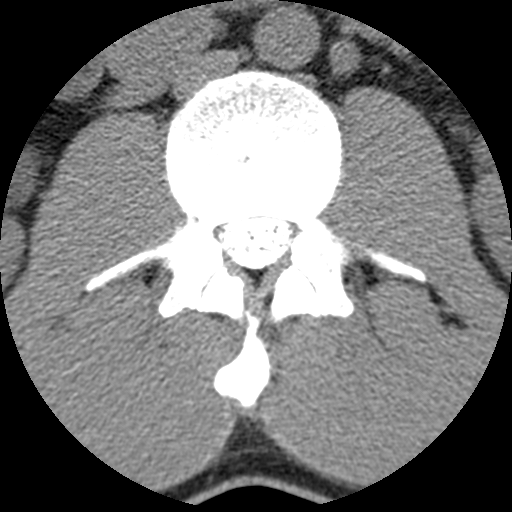

[Series 400: cor · coronal · 0.44mm/px · 3 of 50 slices shown]
[im 10/50  bone]
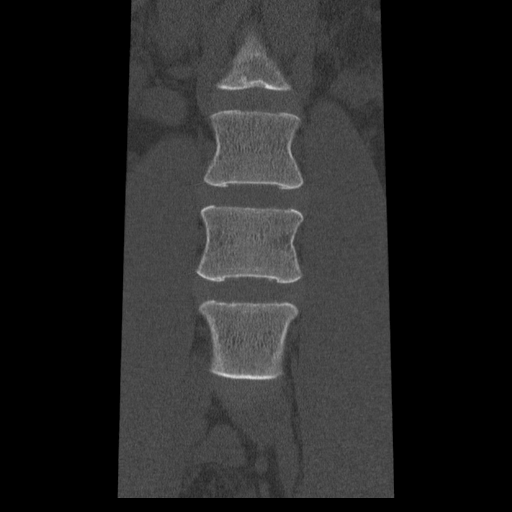
[im 20/50  bone]
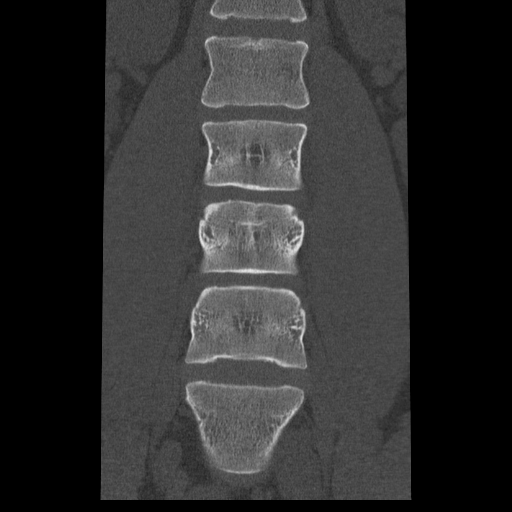
[im 30/50  bone]
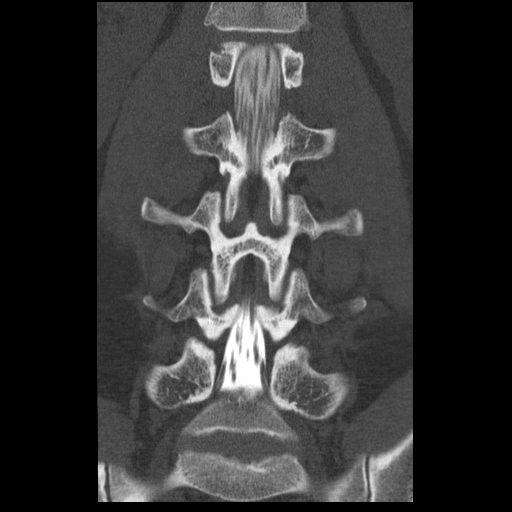

[Series 401: sag · sagittal · 0.44mm/px · 5 of 40 slices shown, 6 images]
[im 14/40  bone]
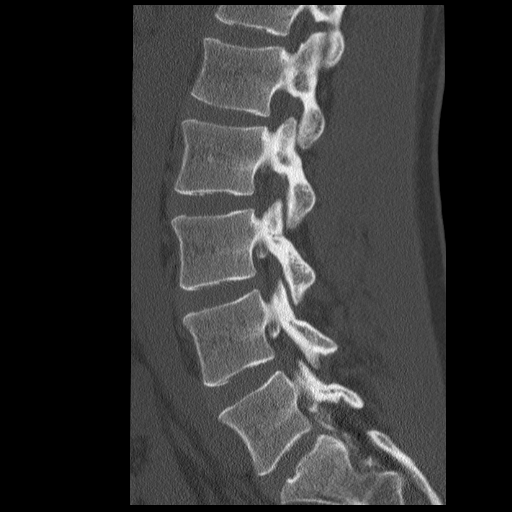
[im 17/40  bone]
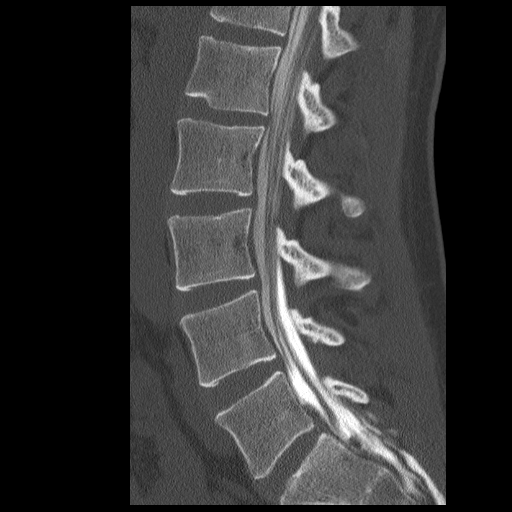
[im 20/40  soft-tissue]
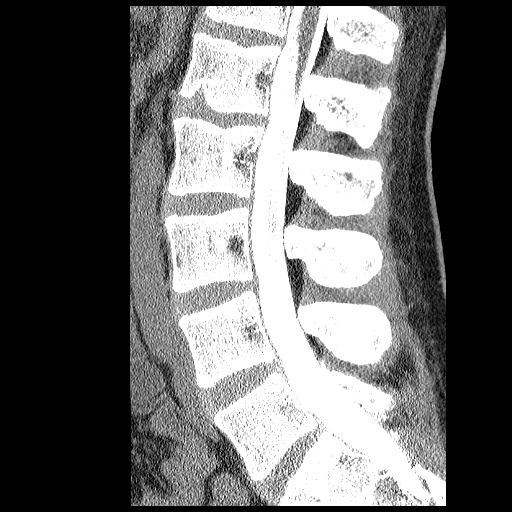
[im 20/40  bone]
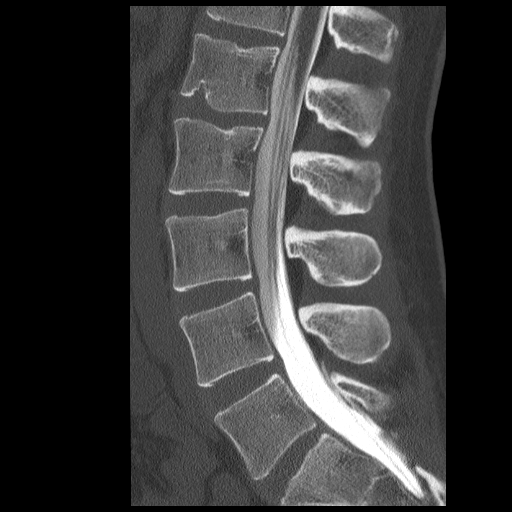
[im 23/40  bone]
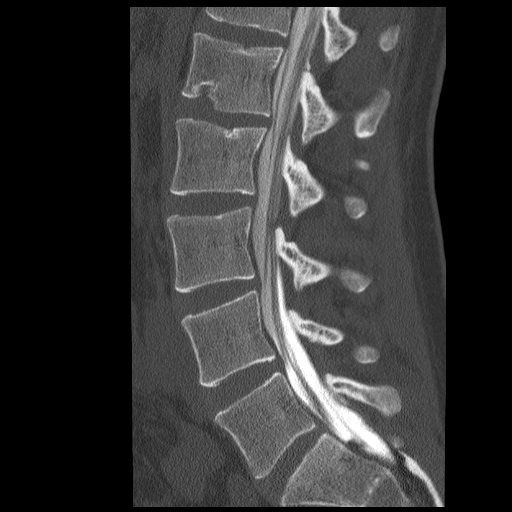
[im 27/40  bone]
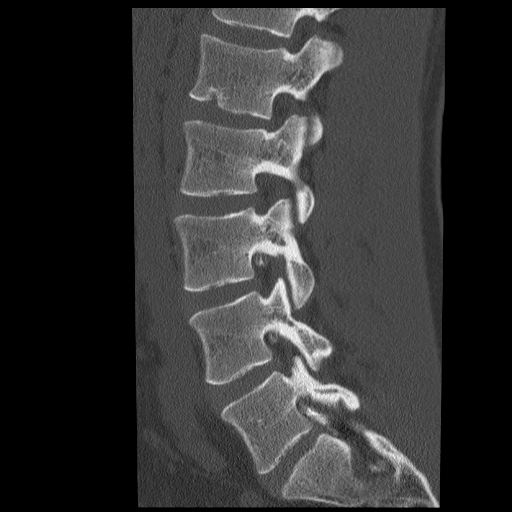

[14 of 33 positions shown; findings below may reference images not displayed]

IMPRESSION: Successful injection of  intrathecal contrast for myelography.

MYELOGRAM LUMBAR
FINDINGS: Good opacification lumbar subarachnoid space.  No nerve
root cut off or spinal stenosis.  Mild annular bulging L4-L5 and L3-
L4.  Anatomic alignment.  No dynamic instability with standing
flexion/extension films.

Fluoroscopy Time: 1.12 minutes
IMPRESSION: As above.

CT MYELOGRAPHY LUMBAR SPINE
FINDINGS: No prevertebral or paraspinous masses.

L1-2: Schmorl's nodes slightly diminished interspace height.  No
posterior protrusion.

L2-3: Normal.

L3-4: Mild annular bulging.  No neural compression.

L4-5: Mild annular bulging.  No neural compression.

L5-S1: Normal interspace.

Compared with prior MR, there is good general agreement.
IMPRESSION: Mild annular bulging L3-4 and L4-5.  No disc protrusion, spinal
stenosis, or nerve root encroachment.

## 2013-12-17 ENCOUNTER — Telehealth (INDEPENDENT_AMBULATORY_CARE_PROVIDER_SITE_OTHER): Payer: 59

## 2013-12-17 DIAGNOSIS — Z23 Encounter for immunization: Secondary | ICD-10-CM

## 2013-12-17 NOTE — Telephone Encounter (Signed)
Pt needs to schedule a CPE also.

## 2013-12-17 NOTE — Telephone Encounter (Signed)
Brandon Burch  Could you pit in a order for him to receive a tdap injection tomorrow at 2:15, He and his wife are having a baby and he wants to have the whooping cough vaccine before baby arrives.

## 2013-12-17 NOTE — Telephone Encounter (Signed)
Ok to schedule per tabori.

## 2013-12-18 ENCOUNTER — Ambulatory Visit (INDEPENDENT_AMBULATORY_CARE_PROVIDER_SITE_OTHER): Payer: 59 | Admitting: *Deleted

## 2013-12-18 DIAGNOSIS — Z23 Encounter for immunization: Secondary | ICD-10-CM

## 2013-12-18 NOTE — Addendum Note (Signed)
Addended by: Eustace QuailEABOLD, Lissy Deuser J on: 12/18/2013 02:33 PM   Modules accepted: Orders

## 2019-02-19 ENCOUNTER — Other Ambulatory Visit: Payer: Self-pay

## 2019-02-20 ENCOUNTER — Ambulatory Visit (INDEPENDENT_AMBULATORY_CARE_PROVIDER_SITE_OTHER): Payer: 59 | Admitting: Internal Medicine

## 2019-02-20 ENCOUNTER — Encounter: Payer: Self-pay | Admitting: Internal Medicine

## 2019-02-20 VITALS — BP 100/60 | HR 55 | Temp 98.1°F | Wt 213.6 lb

## 2019-02-20 DIAGNOSIS — F331 Major depressive disorder, recurrent, moderate: Secondary | ICD-10-CM | POA: Diagnosis not present

## 2019-02-20 DIAGNOSIS — Z720 Tobacco use: Secondary | ICD-10-CM | POA: Insufficient documentation

## 2019-02-20 DIAGNOSIS — F1010 Alcohol abuse, uncomplicated: Secondary | ICD-10-CM | POA: Insufficient documentation

## 2019-02-20 DIAGNOSIS — F329 Major depressive disorder, single episode, unspecified: Secondary | ICD-10-CM | POA: Insufficient documentation

## 2019-02-20 DIAGNOSIS — F1910 Other psychoactive substance abuse, uncomplicated: Secondary | ICD-10-CM | POA: Diagnosis not present

## 2019-02-20 DIAGNOSIS — F32A Depression, unspecified: Secondary | ICD-10-CM | POA: Insufficient documentation

## 2019-02-20 MED ORDER — BUPROPION HCL ER (XL) 150 MG PO TB24: 150.0000 mg | ORAL_TABLET | Freq: Every day | ORAL | 1 refills | Status: DC

## 2019-02-20 NOTE — Progress Notes (Signed)
New Patient Office Visit     This visit occurred during the SARS-CoV-2 public health emergency.  Safety protocols were in place, including screening questions prior to the visit, additional usage of staff PPE, and extensive cleaning of exam room while observing appropriate contact time as indicated for disinfecting solutions.    CC/Reason for Visit: Establish care, discuss mental health issues Previous PCP: None recently Last Visit: 2014  HPI: GLENDELL FOUSE is a 38 y.o. male who is coming in today for the above mentioned reasons. Past Medical History is significant for: Polysubstance abuse, per him he has been drug-free for about 10 years but used to do cocaine, marijuana and ecstasy.  Also has a prior history of tobacco abuse but quit about 10 years ago.  He has a prior history of alcohol abuse where he was a binge drinker and would drink upwards of 100 ounces of beer every day.  He also has a history of ADHD as a child depression anxiety.  He is here to establish care.  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  His past surgical history is significant for vasectomy and a left shoulder repair.  He used to be a smoker of about a pack a day for around 20 years but quit 10 years ago.  Drinks about 40 ounce beer every day.  His family history significant for a sister and a brother who have been diagnosed with bipolar disorder in mom and dad with depression, he takes no chronic medications.  He is currently going through some issues with his spouse.  They are going through marital counseling.   Past Medical/Surgical History: Past Medical History:  Diagnosis Date  . Alcohol abuse   . Depression   . Hernia   . Polysubstance abuse ([place])   . Tobacco abuse     Past Surgical History:  Procedure Laterality Date  . Shoulder Surgery  2004   Left Shoulder  . VENTRAL HERNIA REPAIR  08/28/2011   Procedure: HERNIA REPAIR VENTRAL ADULT;   Surgeon: Madilyn Hook, DO;  Location: [facility];  Service: General;  Laterality: N/A;  Repair of Ventral Hernia    Social History:  reports that he quit smoking about 9 years ago. He has never used smokeless tobacco. He reports current alcohol use. He reports previous drug use. Drugs: Cocaine, Marijuana, and MDMA (Ecstacy).  Allergies: No Known Allergies  Family History:  Family History  Problem Relation Age of Onset  . Depression Mother   . Depression Father   . Bipolar disorder Sister   . Bipolar disorder Brother      Current Outpatient Medications:  .  buPROPion (WELLBUTRIN XL) 150 MG 24 hr tablet, Take 1 tablet (150 mg total) by mouth daily., Disp: 90 tablet, Rfl: 1  Review of Systems:  Constitutional: Denies fever, chills, diaphoresis, appetite change and fatigue.  HEENT: Denies photophobia, eye pain, redness, hearing loss, ear pain, congestion, sore throat, rhinorrhea, sneezing, mouth sores, trouble swallowing, neck pain, neck stiffness and tinnitus.   Respiratory: Denies SOB, DOE, cough, chest tightness,  and wheezing.   Cardiovascular: Denies chest pain, palpitations and leg swelling.  Gastrointestinal: Denies nausea, vomiting, abdominal pain, diarrhea, constipation, blood in stool and abdominal distention.  Genitourinary: Denies dysuria, urgency, frequency, hematuria, flank pain and difficulty urinating.  Endocrine: Denies: hot or cold intolerance, sweats, changes in hair or nails, polyuria, polydipsia. Musculoskeletal: Denies myalgias, back pain, joint swelling, arthralgias and gait  problem.  Skin: Denies pallor, rash and wound.  Neurological: Denies dizziness, seizures, syncope, weakness, light-headedness, numbness and headaches.  Hematological: Denies adenopathy. Easy bruising, personal or family bleeding history  Psychiatric/Behavioral: Denies suicidal ideation,  confusion, nervousness, and agitation    Physical Exam: Vitals:   02/20/19 1342  BP: 100/60  Pulse: (!)  55  Temp: 98.1 F (36.7 C)  TempSrc: Temporal  SpO2: 97%  Weight: 213 lb 9.6 oz (96.9 kg)   Body mass index is 28.77 kg/m.  Constitutional: NAD, calm, comfortable Eyes: PERRL, lids and conjunctivae normal ENMT: Mucous membranes are moist. Respiratory: clear to auscultation bilaterally, no wheezing, no crackles. Normal respiratory effort. No accessory muscle use.  Cardiovascular: Regular rate and rhythm, no murmurs / rubs / gallops. No extremity edema. 2+ pedal pulses.   Abdomen: no tenderness, no masses palpated. No hepatosplenomegaly. Bowel sounds positive.  Musculoskeletal: no clubbing / cyanosis. No joint deformity upper and lower extremities. Good ROM, no contractures. Normal muscle tone.  Skin: no rashes, lesions, ulcers. No induration Neurologic: Grossly intact and nonfocal Psychiatric: Normal judgment and insight. Alert and oriented x 3.  Mood appears depressed   Impression and Plan:  Moderate episode of recurrent major depressive disorder (HCC)   Office Visit from 02/20/2019 in [facility] [place]  PHQ-9 Total Score  15     -Start Wellbutrin 150 mg daily. -He will contact Mr. Maggie Font for therapy sessions. -Follow-up with me in 8 weeks.  Polysubstance abuse (HCC) -Not currently  Alcohol abuse -Will continue to discuss at subsequent visits.  Tobacco abuse -He quit smoking 10 years ago per report  He will be returning for CPE in 2 months.    Patient Instructions  -Nice seeing you today!!  -Start wellbutrin 150 mg daily.  -Schedule therapy sessions with Mr. Maggie Font.  -Schedule follow up in 2 months for your physical, please come in fasting that day.     Chaya Jan, MD [facility]

## 2019-02-20 NOTE — Patient Instructions (Signed)
-  Nice seeing you today!!  -Start wellbutrin 150 mg daily.  -Schedule therapy sessions with Mr. Dennison Bulla.  -Schedule follow up in 2 months for your physical, please come in fasting that day.

## 2019-03-17 ENCOUNTER — Ambulatory Visit (INDEPENDENT_AMBULATORY_CARE_PROVIDER_SITE_OTHER): Payer: 59 | Admitting: Psychology

## 2019-03-17 DIAGNOSIS — F4321 Adjustment disorder with depressed mood: Secondary | ICD-10-CM | POA: Diagnosis not present

## 2019-04-01 ENCOUNTER — Ambulatory Visit (INDEPENDENT_AMBULATORY_CARE_PROVIDER_SITE_OTHER): Payer: 59 | Admitting: Psychology

## 2019-04-01 DIAGNOSIS — F4321 Adjustment disorder with depressed mood: Secondary | ICD-10-CM | POA: Diagnosis not present

## 2019-04-16 ENCOUNTER — Ambulatory Visit (INDEPENDENT_AMBULATORY_CARE_PROVIDER_SITE_OTHER): Payer: 59 | Admitting: Psychology

## 2019-04-16 DIAGNOSIS — F4321 Adjustment disorder with depressed mood: Secondary | ICD-10-CM | POA: Diagnosis not present

## 2019-04-30 ENCOUNTER — Ambulatory Visit (INDEPENDENT_AMBULATORY_CARE_PROVIDER_SITE_OTHER): Payer: 59 | Admitting: Psychology

## 2019-04-30 DIAGNOSIS — F4321 Adjustment disorder with depressed mood: Secondary | ICD-10-CM

## 2019-05-07 ENCOUNTER — Ambulatory Visit (INDEPENDENT_AMBULATORY_CARE_PROVIDER_SITE_OTHER): Payer: 59 | Admitting: Psychology

## 2019-05-07 DIAGNOSIS — F4321 Adjustment disorder with depressed mood: Secondary | ICD-10-CM

## 2019-05-15 ENCOUNTER — Ambulatory Visit (INDEPENDENT_AMBULATORY_CARE_PROVIDER_SITE_OTHER): Payer: 59 | Admitting: Psychology

## 2019-05-15 DIAGNOSIS — F4321 Adjustment disorder with depressed mood: Secondary | ICD-10-CM

## 2019-05-29 ENCOUNTER — Ambulatory Visit: Payer: 59 | Admitting: Psychology

## 2019-05-29 ENCOUNTER — Ambulatory Visit (INDEPENDENT_AMBULATORY_CARE_PROVIDER_SITE_OTHER): Payer: 59 | Admitting: Psychology

## 2019-05-29 DIAGNOSIS — F4321 Adjustment disorder with depressed mood: Secondary | ICD-10-CM | POA: Diagnosis not present

## 2019-06-10 ENCOUNTER — Ambulatory Visit (INDEPENDENT_AMBULATORY_CARE_PROVIDER_SITE_OTHER): Payer: 59 | Admitting: Psychology

## 2019-06-10 DIAGNOSIS — F4321 Adjustment disorder with depressed mood: Secondary | ICD-10-CM | POA: Diagnosis not present

## 2019-07-01 ENCOUNTER — Ambulatory Visit (INDEPENDENT_AMBULATORY_CARE_PROVIDER_SITE_OTHER): Payer: 59 | Admitting: Psychology

## 2019-07-01 DIAGNOSIS — F4321 Adjustment disorder with depressed mood: Secondary | ICD-10-CM | POA: Diagnosis not present

## 2021-02-12 ENCOUNTER — Other Ambulatory Visit: Payer: Self-pay

## 2021-02-12 ENCOUNTER — Emergency Department (INDEPENDENT_AMBULATORY_CARE_PROVIDER_SITE_OTHER)
Admission: EM | Admit: 2021-02-12 | Discharge: 2021-02-12 | Disposition: A | Discharge status: Home / Self Care | Payer: Worker's Compensation | Source: Home / Self Care

## 2021-02-12 ENCOUNTER — Emergency Department (INDEPENDENT_AMBULATORY_CARE_PROVIDER_SITE_OTHER): Payer: Worker's Compensation

## 2021-02-12 DIAGNOSIS — M25512 Pain in left shoulder: Secondary | ICD-10-CM | POA: Diagnosis not present

## 2021-02-12 DIAGNOSIS — S4992XA Unspecified injury of left shoulder and upper arm, initial encounter: Secondary | ICD-10-CM | POA: Diagnosis not present

## 2021-02-12 MED ORDER — BACLOFEN 10 MG PO TABS: 10.0000 mg | ORAL_TABLET | Freq: Three times a day (TID) | ORAL | 0 refills | Status: DC

## 2021-02-12 MED ORDER — PREDNISONE 20 MG PO TABS: ORAL_TABLET | ORAL | 0 refills | Status: DC

## 2021-02-12 NOTE — ED Triage Notes (Signed)
Pt presents to Urgent Care with c/o L shoulder pain following injury at work on 01/26/21. Reports "dragging a crate" which weighed 150 lbs. States pain has decreased somewhat since injury, but reports some numbness in L shoulder this morning.

## 2021-02-12 NOTE — ED Provider Notes (Signed)
[facility]    CSN: [id number] Arrival date & time: 02/12/21  0907      History   Chief Complaint Chief Complaint  Patient presents with   Shoulder Injury    HPI Brandon [facility] is a 40 y.o. male.   HPI 40 year old male presents with left shoulder pain following injury at work on 01/26/2021.  Patient reports dragging a crate which weighed 150 pounds.  Reports pain has decreased somewhat since injury but still reports some numbness in the left shoulder.  Past Medical History:  Diagnosis Date   Abuse, drug or alcohol (HCC)    Alcohol abuse    Depression    Frequent headaches    Hernia    Polysubstance abuse (HCC)    Tobacco abuse     Patient Active Problem List   Diagnosis Date Noted   Depression    Polysubstance abuse (HCC)    Alcohol abuse    Tobacco abuse    Allergic rhinitis 07/30/2012   Urinary frequency 07/11/2011   Epigastric hernia 07/11/2011    Past Surgical History:  Procedure Laterality Date   Shoulder Surgery  2004   Left Shoulder   VASECTOMY     VENTRAL HERNIA REPAIR  08/28/2011   Procedure: HERNIA REPAIR VENTRAL ADULT;  Surgeon: Lodema Pilot, DO;  Location: [facility];  Service: General;  Laterality: N/A;  Repair of Ventral Hernia       Home Medications    Prior to Admission medications   Medication Sig Start Date End Date Taking? Authorizing Provider  baclofen (LIORESAL) 10 MG tablet Take 1 tablet (10 mg total) by mouth 3 (three) times daily. 02/12/21  Yes Trevor Iha, FNP  ibuprofen (ADVIL) 400 MG tablet Take 400 mg by mouth every 6 (six) hours as needed.   Yes [provider]  predniSONE (DELTASONE) 20 MG tablet Take 3 tabs PO daily x 3 days, then 2 tabs PO daily x 3 days, then 1 tab PO daily x 3 days 02/12/21  Yes Trevor Iha, FNP  buPROPion (WELLBUTRIN XL) 150 MG 24 hr tablet Take 1 tablet (150 mg total) by mouth daily. 02/20/19   Philip Aspen, Limmie Patricia, MD    Family History Family History  Problem  Relation Age of Onset   Depression Mother    Arthritis Mother    Hyperlipidemia Mother    Hypertension Mother    Depression Father    Bipolar disorder Sister    Depression Sister    Bipolar disorder Brother    Depression Brother     Social History Social History   Tobacco Use   Smoking status: Former    Types: Cigarettes    Quit date: 03/12/2009    Years since quitting: 11.9   Smokeless tobacco: Never  Vaping Use   Vaping Use: Never used  Substance Use Topics   Alcohol use: Not Currently   Drug use: Not Currently    Types: Cocaine, Marijuana, MDMA (Ecstacy)    Comment: former user     Allergies   Patient has no known allergies.   Review of Systems Review of Systems  Musculoskeletal:        Left shoulder pain since 01/27/2019    Physical Exam Triage Vital Signs ED Triage Vitals  Enc Vitals Group     BP 02/12/21 0936 (!) 152/88     Pulse Rate 02/12/21 0936 68     Resp 02/12/21 0936 20     Temp 02/12/21 0936 98.2 F (36.8 C)  Temp Source 02/12/21 0936 Oral     SpO2 02/12/21 0936 100 %     Weight 02/12/21 0934 210 lb (95.3 kg)     Height 02/12/21 0934 6' (1.829 m)     Head Circumference --      Peak Flow --      Pain Score 02/12/21 0933 8     Pain Loc --      Pain Edu? --      Excl. in GC? --    No data found.  Updated Vital Signs BP (!) 152/88 (BP Location: Right Arm)   Pulse 68   Temp 98.2 F (36.8 C) (Oral)   Resp 20   Ht 6' (1.829 m)   Wt 210 lb (95.3 kg)   SpO2 100%   BMI 28.48 kg/m    Physical Exam Vitals and nursing note reviewed.  Constitutional:      Appearance: Normal appearance. He is normal weight.  HENT:     Head: Normocephalic and atraumatic.     Mouth/Throat:     Mouth: Mucous membranes are moist.     Pharynx: Oropharynx is clear.  Eyes:     Extraocular Movements: Extraocular movements intact.     Conjunctiva/sclera: Conjunctivae normal.     Pupils: Pupils are equal, round, and reactive to light.  Cardiovascular:      Rate and Rhythm: Normal rate and regular rhythm.     Pulses: Normal pulses.     Heart sounds: Normal heart sounds.  Pulmonary:     Effort: Pulmonary effort is normal.     Breath sounds: Normal breath sounds.  Musculoskeletal:     Cervical back: Normal range of motion and neck supple.     Comments: Left shoulder: TTP over AC/GH joints, limited range of motion with forward flexion/extension, full range of motion with internal/external rotation  Skin:    General: Skin is warm and dry.  Neurological:     General: No focal deficit present.     Mental Status: He is alert and oriented to person, place, and time.     UC Treatments / Results  Labs (all labs ordered are listed, but only abnormal results are displayed) Labs Reviewed - No data to display  EKG   Radiology DG Shoulder Left  Result Date: 02/12/2021 CLINICAL DATA:  Shoulder pain after injury. EXAM: LEFT SHOULDER - 2+ VIEW COMPARISON:  None. FINDINGS: Osseous alignment is normal. No fracture line or displaced fracture fragment is seen. Faint calcification overlying the glenoid fossa, likely calcific tendinopathy. Soft tissues about the LEFT shoulder are otherwise unremarkable. IMPRESSION: 1. No acute findings. No fracture or dislocation. 2. 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 Electronically Signed   By: Bary Richard M.D.   On: 02/12/2021 10:07    Procedures Procedures (including critical care time)  Medications Ordered in UC Medications - No data to display  Initial Impression / Assessment and Plan / UC Course  I have reviewed the triage vital signs and the nursing notes.  Pertinent labs & imaging results that were available during my care of the patient were reviewed by me and considered in my medical decision making (see chart for details).     MDM: 1.  Acute pain of left shoulder-Rx'd Prednisone taper and Baclofen. Advised patient to take medication as directed with  food to completion.  Advised patient may take Baclofen daily or as needed for concurrent muscle spasms/muscle strain.  Advised patient if symptoms worsen and or  unresolved to please follow-up with [facility] orthopedic provider (contact information provided above).  Encouraged patient to increase daily water intake while taking these medications.  Patient discharged home, hemodynamically stable. Final Clinical Impressions(s) / UC Diagnoses   Final diagnoses:  Acute pain of left shoulder     Discharge Instructions      Advised patient to take medication as directed with food to completion.  Advised patient may take baclofen daily or as needed for concurrent muscle spasms/muscle strain.  Advised patient if symptoms worsen and or unresolved to please follow-up with [facility] orthopedic provider (contact information provided above).  Encouraged patient to increase daily water intake while taking these medications.     ED Prescriptions     Medication Sig Dispense Auth. Provider   predniSONE (DELTASONE) 20 MG tablet Take 3 tabs PO daily x 3 days, then 2 tabs PO daily x 3 days, then 1 tab PO daily x 3 days 18 tablet Trevor Iha, FNP   baclofen (LIORESAL) 10 MG tablet Take 1 tablet (10 mg total) by mouth 3 (three) times daily. 30 each Trevor Iha, FNP      PDMP not reviewed this encounter.   Trevor Iha, FNP 02/12/21 1058

## 2021-02-12 NOTE — Discharge Instructions (Addendum)
Advised patient to take medication as directed with food to completion.  Advised patient may take Baclofen daily or as needed for concurrent muscle spasms/muscle strain.  Advised patient if symptoms worsen and or unresolved to please follow-up with [facility] orthopedic provider (contact information provided above).  Encouraged patient to increase daily water intake while taking these medications.

## 2021-07-06 ENCOUNTER — Ambulatory Visit: Payer: Self-pay | Admitting: Podiatry

## 2023-01-01 ENCOUNTER — Other Ambulatory Visit: Payer: Self-pay

## 2023-01-01 ENCOUNTER — Encounter (HOSPITAL_BASED_OUTPATIENT_CLINIC_OR_DEPARTMENT_OTHER): Payer: Self-pay | Admitting: Orthopaedic Surgery

## 2023-01-08 NOTE — Discharge Instructions (Signed)
Netta Cedars, MD [facility]  Please read the following information regarding your care after surgery.  Medications  You only need a prescription for the narcotic pain medicine (ex. oxycodone, Percocet, Norco).  All of the other medicines listed below are available over the counter. ? Aleve 2 pills twice a day for the first 3 days after surgery. ? acetominophen (Tylenol) 650 mg every 4-6 hours as you need for minor to moderate pain ? oxycodone as prescribed for severe pain  ? To help prevent blood clots, take aspirin (81 mg) twice daily for 28 days after surgery.  You should also get up every hour while you are awake to move around.  Weight Bearing ? Once nerve block is completely worn off, OK to heel weightbear in postop shoe on the operated leg or foot. This means do NOT touch the front of your surgical leg to the ground!  Cast / Splint / Dressing ? If you have a dressing, do NOT remove this. Keep your splint, cast or dressing clean and dry.  Don't put anything (coat hanger, pencil, etc) down inside of it.  If it gets wet, call the office immediately to schedule an appointment for a cast change.  Swelling IMPORTANT: It is normal for you to have swelling where you had surgery. To reduce swelling and pain, keep at least 3 pillows under your leg so that your toes are above your nose and your heel is above the level of your hip.  It may be necessary to keep your foot or leg elevated for several weeks.  This is critical to helping your incisions heal and your pain to feel better.  Follow Up Call my office at [phone] when you are discharged from the hospital or surgery center to schedule an appointment to be seen 7-10 days after surgery.  Call my office at [phone] if you develop a fever >101.5 F, nausea, vomiting, bleeding from the surgical site or severe pain.    No tylenol until 1:00.  Post Anesthesia Home Care Instructions  Activity: Get plenty of rest for the remainder  of the day. A responsible individual must stay with you for 24 hours following the procedure.  For the next 24 hours, DO NOT: -Drive a car -Advertising copywriter -Drink alcoholic beverages -Take any medication unless instructed by your physician -Make any legal decisions or sign important papers.  Meals: Start with liquid foods such as gelatin or soup. Progress to regular foods as tolerated. Avoid greasy, spicy, heavy foods. If nausea and/or vomiting occur, drink only clear liquids until the nausea and/or vomiting subsides. Call your physician if vomiting continues.  Special Instructions/Symptoms: Your throat may feel dry or sore from the anesthesia or the breathing tube placed in your throat during surgery. If this causes discomfort, gargle with warm salt water. The discomfort should disappear within 24 hours.  If you had a scopolamine patch placed behind your ear for the management of post- operative nausea and/or vomiting:  1. The medication in the patch is effective for 72 hours, after which it should be removed.  Wrap patch in a tissue and discard in the trash. Wash hands thoroughly with soap and water. 2. You may remove the patch earlier than 72 hours if you experience unpleasant side effects which may include dry mouth, dizziness or visual disturbances. 3. Avoid touching the patch. Wash your hands with soap and water after contact with the patch.

## 2023-01-08 NOTE — H&P (Signed)
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   Past Medical History:  Diagnosis Date   Alcohol abuse    Depression    Frequent headaches    Hernia    Polysubstance abuse (HCC)    no use since 2003   Tobacco abuse     Past Surgical History:  Procedure Laterality Date   Shoulder Surgery  2004   Left Shoulder   VASECTOMY     VENTRAL HERNIA REPAIR  08/28/2011   Procedure: HERNIA REPAIR VENTRAL ADULT;  Surgeon: Lodema Pilot, DO;  Location: [facility];  Service: General;  Laterality: N/A;  Repair of Ventral Hernia     (Not in an outpatient encounter)    No Known Allergies  Social History   Socioeconomic History   Marital status: Married    Spouse name: Not on file   Number of children: 1   Years of education: Not on file   Highest education level: Not on file  Occupational History    Employer: [employer]  Tobacco Use   Smoking status: Former    Current packs/day: 0.00    Types: Cigarettes    Quit date: 03/12/2009    Years since quitting: 13.8   Smokeless tobacco: Never  Vaping Use   Vaping status: Never Used  Substance and Sexual Activity   Alcohol use: Not Currently   Drug use: Not Currently    Types: Cocaine, Marijuana, MDMA (Ecstacy)    Comment: former user- in recovery since early 2000's   Sexual activity: Yes  Other Topics Concern   Not on file  Social History Narrative   Not on file   Social Determinants of Health   Financial Resource Strain: Not on file  Food Insecurity: Not on file  Transportation Needs: Not on file  Physical Activity: Not on file  Stress: Not on file  Social Connections: Unknown (07/15/2021)   Received from [facility]   Social Network    Social Network: Not on file  Intimate Partner Violence: Unknown (06/12/2021)   Received from [facility]   HITS    Physically Hurt: Not on file    Insult or Talk Down To: Not on file    Threaten Physical Harm: Not on file    Scream or Curse:  Not on file     History reviewed. No pertinent family history.   Review of Systems Pertinent items are noted in HPI.  Objective: Vital signs in last 24 hours:    01/01/2023    3:06 PM 02/12/2021    9:36 AM 02/12/2021    9:34 AM  Vitals with BMI  Height 6\' 0"   6\' 0"   Weight 210 lbs  210 lbs  BMI 28.47  28.47  Systolic  152   Diastolic  88   Pulse  68       EXAM: General: Well nourished, well developed. Awake, alert and oriented to time, place, person. Normal mood and affect. No apparent distress. Breathing room air.  Operative Lower Extremity: Alignment - Neutral Deformity - None Skin intact Tenderness to palpation - right 3rd webspace 5/5 TA, PT, GS, Per, EHL, FHL Sensation intact to light touch throughout Palpable DP and PT pulses Special testing: None  The contralateral foot/ankle was examined for comparison and noted to be neurovascularly intact with no localized deformity, swelling, or tenderness.  Imaging Review All images taken were independently reviewed by me.  Assessment/Plan: The clinical and radiographic findings were reviewed and discussed  at length with the patient.  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  We spoke at length about the natural course of these findings. We discussed nonoperative and operative treatment options in detail.  The risks and benefits were presented and reviewed. The risks due to recurrence, suture failure/irritation, new/persistent/recurrent infection, stiffness, nerve/vessel/tendon injury, nonunion/malunion of any fracture, wound healing issues, allograft usage, development of arthritis, failure of this surgery, possibility of external fixation in certain situations, possibility of delayed definitive surgery, need for further surgery, prolonged wound care including further soft tissue coverage procedures, thromboembolic events, anesthesia/medical complications/events perioperatively and beyond, amputation, death among  others were discussed. The patient acknowledged the explanation and agreed to proceed with the plan.  Netta Cedars  Orthopaedic Surgery [facility]

## 2023-01-08 NOTE — Anesthesia Preprocedure Evaluation (Signed)
Anesthesia Evaluation  Patient identified by MRN, date of birth, ID band Patient awake    Reviewed: Allergy & Precautions, H&P , NPO status , Patient's Chart, lab work & pertinent test results  Airway Mallampati: II  TM Distance: >3 FB Neck ROM: full    Dental no notable dental hx.    Pulmonary neg pulmonary ROS, former smoker   Pulmonary exam normal breath sounds clear to auscultation       Cardiovascular Exercise Tolerance: Good negative cardio ROS  Rhythm:regular Rate:Normal     Neuro/Psych  Headaches PSYCHIATRIC DISORDERS  Depression       GI/Hepatic negative GI ROS,,,(+)     substance abuse    Endo/Other  negative endocrine ROS    Renal/GU negative Renal ROS  negative genitourinary   Musculoskeletal   Abdominal   Peds  Hematology negative hematology ROS (+)   Anesthesia Other Findings   Reproductive/Obstetrics negative OB ROS                             Anesthesia Physical Anesthesia Plan  ASA: 3  Anesthesia Plan: General   Post-op Pain Management: Minimal or no pain anticipated, Regional block*, Tylenol PO (pre-op)* and Celebrex PO (pre-op)*   Induction: Intravenous  PONV Risk Score and Plan: 2 and Ondansetron, Dexamethasone and Treatment may vary due to age or medical condition  Airway Management Planned: LMA  Additional Equipment: None  Intra-op Plan:   Post-operative Plan:   Informed Consent: I have reviewed the patients History and Physical, chart, labs and discussed the procedure including the risks, benefits and alternatives for the proposed anesthesia with the patient or authorized representative who has indicated his/her understanding and acceptance.     Dental Advisory Given  Plan Discussed with: CRNA and Anesthesiologist  Anesthesia Plan Comments:         Anesthesia Quick Evaluation

## 2023-01-09 ENCOUNTER — Ambulatory Visit (HOSPITAL_BASED_OUTPATIENT_CLINIC_OR_DEPARTMENT_OTHER): Payer: Self-pay | Admitting: Anesthesiology

## 2023-01-09 ENCOUNTER — Ambulatory Visit (HOSPITAL_BASED_OUTPATIENT_CLINIC_OR_DEPARTMENT_OTHER)
Admission: RE | Admit: 2023-01-09 | Discharge: 2023-01-09 | Disposition: A | Discharge status: Home / Self Care | Payer: BC Managed Care – PPO | Attending: Orthopaedic Surgery | Admitting: Orthopaedic Surgery

## 2023-01-09 ENCOUNTER — Ambulatory Visit (HOSPITAL_BASED_OUTPATIENT_CLINIC_OR_DEPARTMENT_OTHER): Payer: BC Managed Care – PPO | Admitting: Anesthesiology

## 2023-01-09 ENCOUNTER — Encounter (HOSPITAL_BASED_OUTPATIENT_CLINIC_OR_DEPARTMENT_OTHER)
Admission: RE | Disposition: A | Discharge status: Home / Self Care | Payer: Self-pay | Source: Home / Self Care | Attending: Orthopaedic Surgery

## 2023-01-09 ENCOUNTER — Encounter (HOSPITAL_BASED_OUTPATIENT_CLINIC_OR_DEPARTMENT_OTHER): Payer: Self-pay | Admitting: Orthopaedic Surgery

## 2023-01-09 ENCOUNTER — Other Ambulatory Visit: Payer: Self-pay

## 2023-01-09 DIAGNOSIS — Z87891 Personal history of nicotine dependence: Secondary | ICD-10-CM | POA: Insufficient documentation

## 2023-01-09 DIAGNOSIS — G5761 Lesion of plantar nerve, right lower limb: Secondary | ICD-10-CM | POA: Insufficient documentation

## 2023-01-09 HISTORY — PX: EXCISION MORTON'S NEUROMA: SHX5013

## 2023-01-09 SURGERY — EXCISION, MORTON'S NEUROMA
Anesthesia: General | Site: Foot | Laterality: Right

## 2023-01-09 MED ORDER — FENTANYL CITRATE (PF) 100 MCG/2ML IJ SOLN
INTRAMUSCULAR | Status: AC
  Filled 2023-01-09: qty 2

## 2023-01-09 MED ORDER — FENTANYL CITRATE (PF) 100 MCG/2ML IJ SOLN: 25.0000 ug | INTRAMUSCULAR | Status: DC | PRN

## 2023-01-09 MED ORDER — ONDANSETRON HCL 4 MG/2ML IJ SOLN
INTRAMUSCULAR | Status: DC | PRN
  Administered 2023-01-09: 4 mg via INTRAVENOUS

## 2023-01-09 MED ORDER — PROPOFOL 10 MG/ML IV BOLUS
INTRAVENOUS | Status: AC
  Filled 2023-01-09: qty 20

## 2023-01-09 MED ORDER — ONDANSETRON HCL 4 MG/2ML IJ SOLN: 4.0000 mg | Freq: Once | INTRAMUSCULAR | Status: DC | PRN

## 2023-01-09 MED ORDER — CELECOXIB 200 MG PO CAPS
200.0000 mg | ORAL_CAPSULE | Freq: Once | ORAL | Status: AC
  Administered 2023-01-09: 200 mg via ORAL

## 2023-01-09 MED ORDER — CEFAZOLIN SODIUM-DEXTROSE 2-4 GM/100ML-% IV SOLN
2.0000 g | INTRAVENOUS | Status: AC
  Administered 2023-01-09: 2 g via INTRAVENOUS

## 2023-01-09 MED ORDER — CHLORHEXIDINE GLUCONATE 4 % EX SOLN: 60.0000 mL | Freq: Once | CUTANEOUS | Status: DC

## 2023-01-09 MED ORDER — PROPOFOL 10 MG/ML IV BOLUS
INTRAVENOUS | Status: DC | PRN
  Administered 2023-01-09: 200 mg via INTRAVENOUS

## 2023-01-09 MED ORDER — ACETAMINOPHEN 325 MG PO TABS: 325.0000 mg | ORAL_TABLET | ORAL | Status: DC | PRN

## 2023-01-09 MED ORDER — OXYCODONE HCL 5 MG/5ML PO SOLN: 5.0000 mg | Freq: Once | ORAL | Status: DC | PRN

## 2023-01-09 MED ORDER — MEPERIDINE HCL 25 MG/ML IJ SOLN: 6.2500 mg | INTRAMUSCULAR | Status: DC | PRN

## 2023-01-09 MED ORDER — CEFAZOLIN SODIUM-DEXTROSE 2-4 GM/100ML-% IV SOLN
INTRAVENOUS | Status: AC
  Filled 2023-01-09: qty 100

## 2023-01-09 MED ORDER — GLYCOPYRROLATE PF 0.2 MG/ML IJ SOSY
PREFILLED_SYRINGE | INTRAMUSCULAR | Status: AC
  Filled 2023-01-09: qty 1

## 2023-01-09 MED ORDER — GLYCOPYRROLATE 0.2 MG/ML IJ SOLN
INTRAMUSCULAR | Status: DC | PRN
  Administered 2023-01-09: .2 mg via INTRAVENOUS

## 2023-01-09 MED ORDER — FENTANYL CITRATE (PF) 100 MCG/2ML IJ SOLN
INTRAMUSCULAR | Status: DC | PRN
  Administered 2023-01-09 (×2): 25 ug via INTRAVENOUS

## 2023-01-09 MED ORDER — ROPIVACAINE HCL 5 MG/ML IJ SOLN
INTRAMUSCULAR | Status: DC | PRN
  Administered 2023-01-09: 20 mL via PERINEURAL

## 2023-01-09 MED ORDER — LACTATED RINGERS IV SOLN: INTRAVENOUS | Status: DC

## 2023-01-09 MED ORDER — ACETAMINOPHEN 500 MG PO TABS
ORAL_TABLET | ORAL | Status: AC
  Filled 2023-01-09: qty 2

## 2023-01-09 MED ORDER — MIDAZOLAM HCL 2 MG/2ML IJ SOLN
INTRAMUSCULAR | Status: AC
Start: 2023-01-09 — End: ?
  Filled 2023-01-09: qty 2

## 2023-01-09 MED ORDER — CELECOXIB 200 MG PO CAPS
ORAL_CAPSULE | ORAL | Status: AC
  Filled 2023-01-09: qty 1

## 2023-01-09 MED ORDER — POVIDONE-IODINE 10 % EX SOLN
CUTANEOUS | Status: DC | PRN
  Administered 2023-01-09: 1 via TOPICAL

## 2023-01-09 MED ORDER — 0.9 % SODIUM CHLORIDE (POUR BTL) OPTIME
TOPICAL | Status: DC | PRN
  Administered 2023-01-09: 300 mL

## 2023-01-09 MED ORDER — MIDAZOLAM HCL 2 MG/2ML IJ SOLN
2.0000 mg | Freq: Once | INTRAMUSCULAR | Status: AC
  Administered 2023-01-09: 2 mg via INTRAVENOUS

## 2023-01-09 MED ORDER — VANCOMYCIN HCL 500 MG IV SOLR
INTRAVENOUS | Status: DC | PRN
  Administered 2023-01-09: 500 mg via TOPICAL

## 2023-01-09 MED ORDER — ACETAMINOPHEN 500 MG PO TABS
1000.0000 mg | ORAL_TABLET | Freq: Once | ORAL | Status: AC
Start: 2023-01-09 — End: 2023-01-09
  Administered 2023-01-09: 1000 mg via ORAL

## 2023-01-09 MED ORDER — FENTANYL CITRATE (PF) 100 MCG/2ML IJ SOLN
100.0000 ug | Freq: Once | INTRAMUSCULAR | Status: AC
  Administered 2023-01-09: 100 ug via INTRAVENOUS

## 2023-01-09 MED ORDER — CLONIDINE HCL (ANALGESIA) 100 MCG/ML EP SOLN
EPIDURAL | Status: DC | PRN
  Administered 2023-01-09: 100 ug

## 2023-01-09 MED ORDER — ACETAMINOPHEN 160 MG/5ML PO SOLN: 325.0000 mg | ORAL | Status: DC | PRN

## 2023-01-09 MED ORDER — OXYCODONE HCL 5 MG PO TABS: 5.0000 mg | ORAL_TABLET | Freq: Once | ORAL | Status: DC | PRN

## 2023-01-09 MED ORDER — DEXAMETHASONE SODIUM PHOSPHATE 10 MG/ML IJ SOLN
INTRAMUSCULAR | Status: DC | PRN
  Administered 2023-01-09: 10 mg via INTRAVENOUS

## 2023-01-09 MED ORDER — LIDOCAINE HCL (CARDIAC) PF 100 MG/5ML IV SOSY
PREFILLED_SYRINGE | INTRAVENOUS | Status: DC | PRN
  Administered 2023-01-09: 60 mg via INTRAVENOUS

## 2023-01-09 SURGICAL SUPPLY — 54 items
APL PRP STRL LF DISP 70% ISPRP (MISCELLANEOUS) ×1
BANDAGE ESMARK 6X9 LF (GAUZE/BANDAGES/DRESSINGS) IMPLANT
BLADE SURG 15 STRL LF DISP TIS (BLADE) ×4 IMPLANT
BLADE SURG 15 STRL SS (BLADE) ×2
BNDG CMPR 5X4 CHSV STRCH STRL (GAUZE/BANDAGES/DRESSINGS) ×1
BNDG CMPR 5X4 KNIT ELC UNQ LF (GAUZE/BANDAGES/DRESSINGS) ×1
BNDG CMPR 9X6 STRL LF SNTH (GAUZE/BANDAGES/DRESSINGS)
BNDG COHESIVE 4X5 TAN STRL LF (GAUZE/BANDAGES/DRESSINGS) ×2 IMPLANT
BNDG ELASTIC 4INX 5YD STR LF (GAUZE/BANDAGES/DRESSINGS) ×2 IMPLANT
BNDG ESMARK 6X9 LF (GAUZE/BANDAGES/DRESSINGS)
BNDG GAUZE DERMACEA FLUFF 4 (GAUZE/BANDAGES/DRESSINGS) ×2 IMPLANT
BNDG GZE DERMACEA 4 6PLY (GAUZE/BANDAGES/DRESSINGS) ×1
CANISTER SUCT 1200ML W/VALVE (MISCELLANEOUS) IMPLANT
CHLORAPREP W/TINT 26 (MISCELLANEOUS) ×2 IMPLANT
COVER BACK TABLE 60X90IN (DRAPES) ×2 IMPLANT
CUFF TOURN SGL QUICK 34 (TOURNIQUET CUFF) ×1
CUFF TRNQT CYL 34X4.125X (TOURNIQUET CUFF) ×2 IMPLANT
DRAPE EXTREMITY T 121X128X90 (DISPOSABLE) ×2 IMPLANT
DRAPE IMP U-DRAPE 54X76 (DRAPES) ×2 IMPLANT
DRAPE U-SHAPE 47X51 STRL (DRAPES) ×2 IMPLANT
DRSG MEPITEL 4X7.2 (GAUZE/BANDAGES/DRESSINGS) ×2 IMPLANT
ELECT REM PT RETURN 9FT ADLT (ELECTROSURGICAL) ×1
ELECTRODE REM PT RTRN 9FT ADLT (ELECTROSURGICAL) ×2 IMPLANT
GAUZE PAD ABD 8X10 STRL (GAUZE/BANDAGES/DRESSINGS) IMPLANT
GAUZE SPONGE 4X4 12PLY STRL (GAUZE/BANDAGES/DRESSINGS) ×2 IMPLANT
GLOVE BIOGEL PI IND STRL 8 (GLOVE) ×2 IMPLANT
GLOVE SURG SS PI 7.5 STRL IVOR (GLOVE) ×2 IMPLANT
GOWN STRL REUS W/ TWL LRG LVL3 (GOWN DISPOSABLE) ×4 IMPLANT
GOWN STRL REUS W/TWL LRG LVL3 (GOWN DISPOSABLE) ×2
NDL HYPO 22X1.5 SAFETY MO (MISCELLANEOUS) IMPLANT
NEEDLE HYPO 22X1.5 SAFETY MO (MISCELLANEOUS) IMPLANT
NS IRRIG 1000ML POUR BTL (IV SOLUTION) ×2 IMPLANT
PACK BASIN DAY SURGERY FS (CUSTOM PROCEDURE TRAY) ×2 IMPLANT
PAD CAST 4YDX4 CTTN HI CHSV (CAST SUPPLIES) IMPLANT
PADDING CAST ABS COTTON 4X4 ST (CAST SUPPLIES) IMPLANT
PADDING CAST COTTON 4X4 STRL (CAST SUPPLIES)
PENCIL SMOKE EVACUATOR (MISCELLANEOUS) IMPLANT
SHEET MEDIUM DRAPE 40X70 STRL (DRAPES) ×2 IMPLANT
SLEEVE SCD COMPRESS KNEE MED (STOCKING) ×2 IMPLANT
SPIKE FLUID TRANSFER (MISCELLANEOUS) IMPLANT
SPONGE T-LAP 18X18 ~~LOC~~+RFID (SPONGE) ×2 IMPLANT
STOCKINETTE 6 STRL (DRAPES) ×2 IMPLANT
SUCTION TUBE FRAZIER 10FR DISP (SUCTIONS) ×2 IMPLANT
SUT ETHILON 2 0 FS 18 (SUTURE) IMPLANT
SUT ETHILON 3 0 PS 1 (SUTURE) IMPLANT
SUT VIC AB 2-0 SH 27 (SUTURE) ×1
SUT VIC AB 2-0 SH 27XBRD (SUTURE) IMPLANT
SUT VIC AB 3-0 PS2 18 (SUTURE)
SUT VIC AB 3-0 PS2 18XBRD (SUTURE) IMPLANT
SUT VICRYL 0 SH 27 (SUTURE) IMPLANT
SYR BULB EAR ULCER 3OZ GRN STR (SYRINGE) ×2 IMPLANT
TOWEL GREEN STERILE FF (TOWEL DISPOSABLE) ×4 IMPLANT
TUBE CONNECTING 20X1/4 (TUBING) ×2 IMPLANT
UNDERPAD 30X36 HEAVY ABSORB (UNDERPADS AND DIAPERS) ×2 IMPLANT

## 2023-01-09 NOTE — Anesthesia Procedure Notes (Signed)
Procedure Name: LMA Insertion Date/Time: 01/09/2023 7:35 AM  Performed by: Lauralyn Primes, CRNAPre-anesthesia Checklist: Patient identified, Emergency Drugs available, Suction available and Patient being monitored Patient Re-evaluated:Patient Re-evaluated prior to induction Oxygen Delivery Method: Circle system utilized Preoxygenation: Pre-oxygenation with 100% oxygen Induction Type: IV induction Ventilation: Mask ventilation without difficulty LMA: LMA inserted LMA Size: 5.0 Number of attempts: 1 Airway Equipment and Method: Bite block Placement Confirmation: positive ETCO2 Tube secured with: Tape Dental Injury: Teeth and Oropharynx as per pre-operative assessment

## 2023-01-09 NOTE — Op Note (Signed)
01/09/2023  10:00 PM   PATIENT: Brandon Burch  42 y.o. 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   INDICATION FOR PROCEDURE: The patient presented with above diagnosis.  We discussed the diagnosis, alternative treatment options, risks and benefits of the above surgical intervention, as well as alternative non-operative treatments. All questions/concerns were addressed and the patient/family demonstrated appropriate understanding of the diagnosis, the procedure, the postoperative course, and overall prognosis. The patient wished to proceed with surgical intervention and signed an informed surgical consent as such, in each others presence prior to surgery.   PROCEDURE IN DETAIL: After preoperative consent was obtained and the correct operative site was identified, the patient was brought to the operating room supine on stretcher and transferred onto operating table. General anesthesia was induced. Preoperative antibiotics were administered. Surgical timeout was taken. The patient was then positioned supine with an ipsilateral hip bump. The operative lower extremity was prepped and draped in standard sterile fashion with a tourniquet around the thigh. The extremity was exsanguinated and the tourniquet was inflated to 275 mmHg.  We began by making a standard dorsal approach in the third intermetatarsal space. Dissection was carefully carried down to the level of the transverse ligament taking care to protect  tendon and neurovascular structures. This ligament was sharply released revealing a large Morton's neuroma. This neuroma was excised including its branches and stalk traced proximally.  The surgical sites were thoroughly irrigated. The tourniquet was deflated and hemostasis achieved. Betadine and vancomycin powder were applied. The deep layers were closed using 2-0 vicryl. The skin was closed without tension.    The leg was cleaned with saline and sterile dressings with gauze were applied. A well padded bulky wrap was applied. The patient was awakened from anesthesia and transported to the recovery room in stable condition.    FOLLOW UP PLAN: -transfer to PACU, then home -strict NWB operative extremity until nerve block wears off, maximum elevation -maintain dressings until follow up -DVT ppx: Aspirin 81 mg twice daily x 1 month -follow up as outpatient within 7-10 days for wound check -sutures out in 2-3 weeks in outpatient office   RADIOGRAPHS: None   Netta Cedars [facility]

## 2023-01-09 NOTE — H&P (Signed)
H&P Update:  -History and Physical Reviewed  -Patient has been re-examined  -No change in the plan of care  -The risks and benefits were presented and reviewed. The risks due to recurrence, suture failure and/or irritation, new/persistent infection, stiffness, nerve/vessel/tendon injury or rerupture of repaired tendon, nonunion/malunion, allograft usage, wound healing issues, development of arthritis, failure of this surgery, possibility of external fixation with delayed definitive surgery, need for further surgery, thromboembolic events, anesthesia/medical complications, amputation, death among others were discussed. The patient acknowledged the explanation, agreed to proceed with the plan and a consent was signed.  Brandon Burch

## 2023-01-09 NOTE — Anesthesia Procedure Notes (Signed)
Anesthesia Regional Block: Popliteal block   Pre-Anesthetic Checklist: , timeout performed,  Correct Patient, Correct Site, Correct Laterality,  Correct Procedure, Correct Position, site marked,  Risks and benefits discussed,  Surgical consent,  Pre-op evaluation,  At surgeon's request and post-op pain management  Laterality: Right  Prep: chloraprep       Needles:  Injection technique: Single-shot  Needle Type: Echogenic Stimulator Needle     Needle Length: 5cm  Needle Gauge: 22     Additional Needles:   Procedures:, nerve stimulator,,, ultrasound used (permanent image in chart),,     Nerve Stimulator or Paresthesia:  Response: foot, 0.45 mA  Additional Responses:   Narrative:  Start time: 01/09/2023 7:00 AM End time: 01/09/2023 7:05 AM Injection made incrementally with aspirations every 5 mL.  Performed by: Personally  Anesthesiologist: Bethena Midget, MD  Additional Notes: Functioning IV was confirmed and monitors were applied.  A 50mm 22ga Arrow echogenic stimulator needle was used. Sterile prep and drape,hand hygiene and sterile gloves were used. Ultrasound guidance: relevant anatomy identified, needle position confirmed, local anesthetic spread visualized around nerve(s)., vascular puncture avoided.  Image printed for medical record. Negative aspiration and negative test dose prior to incremental administration of local anesthetic. The patient tolerated the procedure well.

## 2023-01-09 NOTE — Transfer of Care (Signed)
Immediate Anesthesia Transfer of Care Note  Patient: Brandon Burch  Procedure(s) Performed: EXCISION MORTON'S NEUROMA, 3rd webspace (Right: Foot)  Patient Location: PACU  Anesthesia Type:General  Level of Consciousness: drowsy and patient cooperative  Airway & Oxygen Therapy: Patient Spontanous Breathing and Patient connected to face mask oxygen  Post-op Assessment: Report given to RN and Post -op Vital signs reviewed and stable  Post vital signs: Reviewed and stable  Last Vitals:  Vitals Value Taken Time  BP 129/76 01/09/23 0825  Temp    Pulse 51 01/09/23 0827  Resp 10 01/09/23 0827  SpO2 100 % 01/09/23 0827  Vitals shown include unfiled device data.  Last Pain:  Vitals:   01/09/23 0625  TempSrc: Temporal  PainSc: 0-No pain      Patients Stated Pain Goal: 3 (01/09/23 9381)  Complications: No notable events documented.

## 2023-01-09 NOTE — Anesthesia Postprocedure Evaluation (Signed)
Anesthesia Post Note  Patient: Brandon Burch  Procedure(s) Performed: EXCISION MORTON'S NEUROMA, 3rd webspace (Right: Foot)     Patient location during evaluation: PACU Anesthesia Type: General Level of consciousness: awake and alert Pain management: pain level controlled Vital Signs Assessment: post-procedure vital signs reviewed and stable Respiratory status: spontaneous breathing, nonlabored ventilation, respiratory function stable and patient connected to nasal cannula oxygen Cardiovascular status: blood pressure returned to baseline and stable Postop Assessment: no apparent nausea or vomiting Anesthetic complications: no   No notable events documented.  Last Vitals:  Vitals:   01/09/23 0900 01/09/23 0921  BP: 128/83 118/74  Pulse: (!) 47 (!) 51  Resp: 11 14  Temp:  36.4 C  SpO2: 99% 99%    Last Pain:  Vitals:   01/09/23 0921  TempSrc:   PainSc: 2                  Nesha Counihan

## 2023-01-09 NOTE — Progress Notes (Signed)
Assisted Dr. Oddono with right, popliteal, ultrasound guided block. Side rails up, monitors on throughout procedure. See vital signs in flow sheet. Tolerated Procedure well. 

## 2023-01-10 ENCOUNTER — Encounter (HOSPITAL_BASED_OUTPATIENT_CLINIC_OR_DEPARTMENT_OTHER): Payer: Self-pay | Admitting: Orthopaedic Surgery

## 2023-01-22 ENCOUNTER — Encounter (HOSPITAL_BASED_OUTPATIENT_CLINIC_OR_DEPARTMENT_OTHER): Payer: Self-pay | Admitting: Orthopaedic Surgery
